# Patient Record
Sex: Female | Born: 1955
Health system: Southern US, Community
[De-identification: ages and names within clinical notes are randomized; demographics above are authoritative.]

## PROBLEM LIST (undated history)

## (undated) DIAGNOSIS — Z8742 Personal history of other diseases of the female genital tract: Secondary | ICD-10-CM

## (undated) DIAGNOSIS — I2510 Atherosclerotic heart disease of native coronary artery without angina pectoris: Secondary | ICD-10-CM

## (undated) DIAGNOSIS — B009 Herpesviral infection, unspecified: Secondary | ICD-10-CM

## (undated) DIAGNOSIS — IMO0002 Reserved for concepts with insufficient information to code with codable children: Secondary | ICD-10-CM

## (undated) DIAGNOSIS — E785 Hyperlipidemia, unspecified: Secondary | ICD-10-CM

## (undated) DIAGNOSIS — M8580 Other specified disorders of bone density and structure, unspecified site: Secondary | ICD-10-CM

## (undated) DIAGNOSIS — I10 Essential (primary) hypertension: Secondary | ICD-10-CM

## (undated) DIAGNOSIS — Z8619 Personal history of other infectious and parasitic diseases: Secondary | ICD-10-CM

## (undated) DIAGNOSIS — K219 Gastro-esophageal reflux disease without esophagitis: Secondary | ICD-10-CM

## (undated) DIAGNOSIS — F329 Major depressive disorder, single episode, unspecified: Secondary | ICD-10-CM

## (undated) DIAGNOSIS — F32A Depression, unspecified: Secondary | ICD-10-CM

## (undated) DIAGNOSIS — C801 Malignant (primary) neoplasm, unspecified: Secondary | ICD-10-CM

## (undated) HISTORY — DX: Major depressive disorder, single episode, unspecified: F32.9

## (undated) HISTORY — DX: Personal history of other infectious and parasitic diseases: Z86.19

## (undated) HISTORY — DX: Reserved for concepts with insufficient information to code with codable children: IMO0002

## (undated) HISTORY — DX: Other specified disorders of bone density and structure, unspecified site: M85.80

## (undated) HISTORY — DX: Hyperlipidemia, unspecified: E78.5

## (undated) HISTORY — DX: Essential (primary) hypertension: I10

## (undated) HISTORY — DX: Herpesviral infection, unspecified: B00.9

## (undated) HISTORY — DX: Atherosclerotic heart disease of native coronary artery without angina pectoris: I25.10

## (undated) HISTORY — DX: Personal history of other diseases of the female genital tract: Z87.42

## (undated) HISTORY — PX: CARDIAC CATHETERIZATION: SHX172

## (undated) HISTORY — DX: Depression, unspecified: F32.A

---
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
## 1983-08-27 HISTORY — PX: COLPOSCOPY: SHX161

## 1988-08-26 DIAGNOSIS — Z8742 Personal history of other diseases of the female genital tract: Secondary | ICD-10-CM

## 1988-08-26 HISTORY — DX: Personal history of other diseases of the female genital tract: Z87.42

## 1988-08-26 HISTORY — PX: TUBAL LIGATION: SHX77

## 1998-08-26 HISTORY — PX: BASAL CELL CARCINOMA EXCISION: SHX1214

## 2003-05-19 ENCOUNTER — Other Ambulatory Visit: Admission: RE | Admit: 2003-05-19 | Discharge: 2003-05-19 | Payer: Self-pay | Admitting: Dermatology

## 2003-07-08 ENCOUNTER — Other Ambulatory Visit: Admission: RE | Admit: 2003-07-08 | Discharge: 2003-07-08 | Payer: Self-pay | Admitting: Obstetrics and Gynecology

## 2003-08-27 LAB — HM COLONOSCOPY: HM Colonoscopy: NORMAL

## 2003-10-28 ENCOUNTER — Other Ambulatory Visit: Admission: RE | Admit: 2003-10-28 | Discharge: 2003-10-28 | Payer: Self-pay | Admitting: Obstetrics and Gynecology

## 2003-11-11 ENCOUNTER — Ambulatory Visit (HOSPITAL_BASED_OUTPATIENT_CLINIC_OR_DEPARTMENT_OTHER): Admission: RE | Admit: 2003-11-11 | Discharge: 2003-11-11 | Payer: Self-pay | Admitting: Urology

## 2003-11-11 ENCOUNTER — Ambulatory Visit (HOSPITAL_COMMUNITY): Admission: RE | Admit: 2003-11-11 | Discharge: 2003-11-11 | Payer: Self-pay | Admitting: Urology

## 2004-03-01 ENCOUNTER — Ambulatory Visit (HOSPITAL_COMMUNITY): Admission: RE | Admit: 2004-03-01 | Discharge: 2004-03-01 | Payer: Self-pay | Admitting: Family Medicine

## 2004-07-24 ENCOUNTER — Ambulatory Visit (HOSPITAL_COMMUNITY): Admission: RE | Admit: 2004-07-24 | Discharge: 2004-07-24 | Payer: Self-pay | Admitting: Orthopedic Surgery

## 2004-09-06 ENCOUNTER — Ambulatory Visit: Payer: Self-pay | Admitting: Family Medicine

## 2004-11-09 ENCOUNTER — Ambulatory Visit (HOSPITAL_COMMUNITY): Admission: RE | Admit: 2004-11-09 | Discharge: 2004-11-09 | Payer: Self-pay | Admitting: General Surgery

## 2004-12-04 ENCOUNTER — Ambulatory Visit (HOSPITAL_COMMUNITY): Admission: RE | Admit: 2004-12-04 | Discharge: 2004-12-04 | Payer: Self-pay | Admitting: Family Medicine

## 2005-10-02 ENCOUNTER — Ambulatory Visit: Payer: Self-pay | Admitting: Family Medicine

## 2005-10-02 LAB — CONVERTED CEMR LAB: Pap Smear: NORMAL

## 2006-02-12 ENCOUNTER — Ambulatory Visit (HOSPITAL_COMMUNITY): Admission: RE | Admit: 2006-02-12 | Discharge: 2006-02-12 | Payer: Self-pay | Admitting: Family Medicine

## 2006-10-02 ENCOUNTER — Encounter: Payer: Self-pay | Admitting: Family Medicine

## 2006-10-02 LAB — CONVERTED CEMR LAB
ALT: 28 units/L (ref 0–35)
AST: 22 units/L (ref 0–37)
Albumin: 4.3 g/dL (ref 3.5–5.2)
Alkaline Phosphatase: 88 units/L (ref 39–117)
BUN: 12 mg/dL (ref 6–23)
Bilirubin, Direct: 0.1 mg/dL (ref 0.0–0.3)
CO2: 24 meq/L (ref 19–32)
Calcium: 9.6 mg/dL (ref 8.4–10.5)
Chloride: 104 meq/L (ref 96–112)
Cholesterol: 166 mg/dL (ref 0–200)
Creatinine, Ser: 0.7 mg/dL (ref 0.40–1.20)
Glucose, Bld: 80 mg/dL (ref 70–99)
HDL: 55 mg/dL (ref >39)
Indirect Bilirubin: 0.3 mg/dL (ref 0.0–0.9)
LDL Cholesterol: 98 mg/dL (ref 0–99)
Potassium: 4.9 meq/L (ref 3.5–5.3)
Sodium: 141 meq/L (ref 135–145)
TSH: 1.519 microintl units/mL (ref 0.350–5.50)
Total Bilirubin: 0.4 mg/dL (ref 0.3–1.2)
Total CHOL/HDL Ratio: 3
Total Protein: 7.4 g/dL (ref 6.0–8.3)
Triglycerides: 66 mg/dL (ref <150)
VLDL: 13 mg/dL (ref 0–40)

## 2006-10-06 ENCOUNTER — Other Ambulatory Visit: Admission: RE | Admit: 2006-10-06 | Discharge: 2006-10-06 | Payer: Self-pay | Admitting: Family Medicine

## 2006-10-06 ENCOUNTER — Ambulatory Visit: Payer: Self-pay | Admitting: Family Medicine

## 2006-10-06 ENCOUNTER — Encounter (INDEPENDENT_AMBULATORY_CARE_PROVIDER_SITE_OTHER): Payer: Self-pay | Admitting: *Deleted

## 2006-10-06 LAB — CONVERTED CEMR LAB: Pap Smear: NORMAL

## 2007-05-13 ENCOUNTER — Ambulatory Visit: Payer: Self-pay | Admitting: Family Medicine

## 2007-05-18 ENCOUNTER — Ambulatory Visit (HOSPITAL_COMMUNITY): Admission: RE | Admit: 2007-05-18 | Discharge: 2007-05-18 | Payer: Self-pay | Admitting: Family Medicine

## 2007-05-19 ENCOUNTER — Ambulatory Visit (HOSPITAL_COMMUNITY): Admission: RE | Admit: 2007-05-19 | Discharge: 2007-05-19 | Payer: Self-pay | Admitting: Family Medicine

## 2007-05-28 ENCOUNTER — Encounter: Payer: Self-pay | Admitting: Family Medicine

## 2007-05-28 LAB — CONVERTED CEMR LAB
ALT: 34 units/L (ref 0–35)
AST: 27 units/L (ref 0–37)
Albumin: 4.5 g/dL (ref 3.5–5.2)
Alkaline Phosphatase: 88 units/L (ref 39–117)
BUN: 10 mg/dL (ref 6–23)
Bilirubin, Direct: 0.1 mg/dL (ref 0.0–0.3)
CO2: 25 meq/L (ref 19–32)
Calcium: 9.4 mg/dL (ref 8.4–10.5)
Chloride: 105 meq/L (ref 96–112)
Cholesterol: 180 mg/dL (ref 0–200)
Creatinine, Ser: 0.74 mg/dL (ref 0.40–1.20)
Glucose, Bld: 84 mg/dL (ref 70–99)
HDL: 56 mg/dL (ref >39)
Indirect Bilirubin: 0.4 mg/dL (ref 0.0–0.9)
LDL Cholesterol: 106 mg/dL — ABNORMAL HIGH (ref 0–99)
Potassium: 4.9 meq/L (ref 3.5–5.3)
Sodium: 142 meq/L (ref 135–145)
Total Bilirubin: 0.5 mg/dL (ref 0.3–1.2)
Total CHOL/HDL Ratio: 3.2
Total Protein: 7.6 g/dL (ref 6.0–8.3)
Triglycerides: 89 mg/dL (ref <150)
VLDL: 18 mg/dL (ref 0–40)

## 2007-08-27 ENCOUNTER — Encounter: Payer: Self-pay | Admitting: Family Medicine

## 2007-10-13 ENCOUNTER — Ambulatory Visit: Payer: Self-pay | Admitting: Family Medicine

## 2007-10-13 ENCOUNTER — Other Ambulatory Visit: Admission: RE | Admit: 2007-10-13 | Discharge: 2007-10-13 | Payer: Self-pay | Admitting: Family Medicine

## 2007-10-13 ENCOUNTER — Encounter: Payer: Self-pay | Admitting: Family Medicine

## 2007-10-13 LAB — CONVERTED CEMR LAB: Pap Smear: NORMAL

## 2008-03-10 ENCOUNTER — Encounter: Payer: Self-pay | Admitting: Family Medicine

## 2008-03-10 LAB — CONVERTED CEMR LAB
ALT: 22 units/L (ref 0–35)
AST: 20 units/L (ref 0–37)
Albumin: 4.4 g/dL (ref 3.5–5.2)
Alkaline Phosphatase: 88 units/L (ref 39–117)
Bilirubin, Direct: 0.1 mg/dL (ref 0.0–0.3)
Cholesterol: 182 mg/dL (ref 0–200)
HDL: 44 mg/dL (ref >39)
Indirect Bilirubin: 0.5 mg/dL (ref 0.0–0.9)
LDL Cholesterol: 110 mg/dL — ABNORMAL HIGH (ref 0–99)
Total Bilirubin: 0.6 mg/dL (ref 0.3–1.2)
Total CHOL/HDL Ratio: 4.1
Total Protein: 7.9 g/dL (ref 6.0–8.3)
Triglycerides: 141 mg/dL (ref <150)
VLDL: 28 mg/dL (ref 0–40)

## 2008-03-11 ENCOUNTER — Encounter: Payer: Self-pay | Admitting: Family Medicine

## 2008-03-11 DIAGNOSIS — M899 Disorder of bone, unspecified: Secondary | ICD-10-CM | POA: Insufficient documentation

## 2008-03-11 DIAGNOSIS — M949 Disorder of cartilage, unspecified: Secondary | ICD-10-CM

## 2008-03-11 DIAGNOSIS — E782 Mixed hyperlipidemia: Secondary | ICD-10-CM | POA: Insufficient documentation

## 2008-03-11 DIAGNOSIS — I10 Essential (primary) hypertension: Secondary | ICD-10-CM

## 2008-03-11 DIAGNOSIS — F329 Major depressive disorder, single episode, unspecified: Secondary | ICD-10-CM

## 2008-04-15 ENCOUNTER — Telehealth: Payer: Self-pay | Admitting: Family Medicine

## 2008-05-25 ENCOUNTER — Ambulatory Visit (HOSPITAL_COMMUNITY): Admission: RE | Admit: 2008-05-25 | Discharge: 2008-05-25 | Payer: Self-pay | Admitting: Family Medicine

## 2008-10-21 ENCOUNTER — Encounter (INDEPENDENT_AMBULATORY_CARE_PROVIDER_SITE_OTHER): Payer: Self-pay | Admitting: Internal Medicine

## 2008-10-21 ENCOUNTER — Ambulatory Visit: Payer: Self-pay | Admitting: Internal Medicine

## 2008-10-21 ENCOUNTER — Other Ambulatory Visit: Admission: RE | Admit: 2008-10-21 | Discharge: 2008-10-21 | Payer: Self-pay | Admitting: Internal Medicine

## 2008-10-21 DIAGNOSIS — Z85828 Personal history of other malignant neoplasm of skin: Secondary | ICD-10-CM

## 2008-10-21 DIAGNOSIS — Z87898 Personal history of other specified conditions: Secondary | ICD-10-CM

## 2008-10-21 DIAGNOSIS — J309 Allergic rhinitis, unspecified: Secondary | ICD-10-CM | POA: Insufficient documentation

## 2008-10-21 DIAGNOSIS — N390 Urinary tract infection, site not specified: Secondary | ICD-10-CM | POA: Insufficient documentation

## 2008-10-21 LAB — CONVERTED CEMR LAB: OCCULT 1: NEGATIVE

## 2008-10-24 ENCOUNTER — Encounter (INDEPENDENT_AMBULATORY_CARE_PROVIDER_SITE_OTHER): Payer: Self-pay | Admitting: Internal Medicine

## 2008-10-25 LAB — CONVERTED CEMR LAB
ALT: 25 units/L (ref 0–35)
AST: 20 units/L (ref 0–37)
Albumin: 4.5 g/dL (ref 3.5–5.2)
Alkaline Phosphatase: 70 units/L (ref 39–117)
BUN: 14 mg/dL (ref 6–23)
CO2: 23 meq/L (ref 19–32)
Calcium: 9.3 mg/dL (ref 8.4–10.5)
Chloride: 107 meq/L (ref 96–112)
Cholesterol: 152 mg/dL (ref 0–200)
Creatinine, Ser: 0.66 mg/dL (ref 0.40–1.20)
Glucose, Bld: 84 mg/dL (ref 70–99)
HDL: 46 mg/dL (ref >39)
LDL Cholesterol: 93 mg/dL (ref 0–99)
Potassium: 5.5 meq/L — ABNORMAL HIGH (ref 3.5–5.3)
Sodium: 143 meq/L (ref 135–145)
Total Bilirubin: 0.6 mg/dL (ref 0.3–1.2)
Total CHOL/HDL Ratio: 3.3
Total Protein: 7.3 g/dL (ref 6.0–8.3)
Triglycerides: 64 mg/dL (ref <150)
VLDL: 13 mg/dL (ref 0–40)

## 2008-10-26 ENCOUNTER — Telehealth (INDEPENDENT_AMBULATORY_CARE_PROVIDER_SITE_OTHER): Payer: Self-pay | Admitting: Internal Medicine

## 2008-10-27 ENCOUNTER — Encounter (INDEPENDENT_AMBULATORY_CARE_PROVIDER_SITE_OTHER): Payer: Self-pay | Admitting: Internal Medicine

## 2008-10-28 ENCOUNTER — Encounter (INDEPENDENT_AMBULATORY_CARE_PROVIDER_SITE_OTHER): Payer: Self-pay | Admitting: Internal Medicine

## 2009-01-16 ENCOUNTER — Ambulatory Visit: Payer: Self-pay | Admitting: Internal Medicine

## 2009-01-16 DIAGNOSIS — H6590 Unspecified nonsuppurative otitis media, unspecified ear: Secondary | ICD-10-CM | POA: Insufficient documentation

## 2009-05-19 ENCOUNTER — Ambulatory Visit (HOSPITAL_COMMUNITY): Admission: RE | Admit: 2009-05-19 | Discharge: 2009-05-19 | Payer: Self-pay | Admitting: Family Medicine

## 2009-05-31 ENCOUNTER — Ambulatory Visit (HOSPITAL_COMMUNITY): Admission: RE | Admit: 2009-05-31 | Discharge: 2009-05-31 | Payer: Self-pay | Admitting: Family Medicine

## 2010-07-30 ENCOUNTER — Ambulatory Visit (HOSPITAL_COMMUNITY)
Admission: RE | Admit: 2010-07-30 | Discharge: 2010-07-30 | Payer: Self-pay | Source: Home / Self Care | Admitting: Internal Medicine

## 2010-09-03 ENCOUNTER — Encounter: Payer: Self-pay | Admitting: Cardiology

## 2010-09-15 ENCOUNTER — Encounter: Payer: Self-pay | Admitting: Family Medicine

## 2010-09-16 ENCOUNTER — Encounter: Payer: Self-pay | Admitting: Internal Medicine

## 2010-09-25 NOTE — Letter (Signed)
Summary: RPC chart  RPC chart   Imported By: Curtis Sites 03/15/2010 12:47:22  _____________________________________________________________________  External Attachment:    Type:   Image     Comment:   External Document

## 2011-01-11 NOTE — Op Note (Signed)
Melinda Garza, Melinda Garza                           ACCOUNT NO.:  [id number]   MEDICAL RECORD NO.:  [id number]                   PATIENT TYPE:  AMB   LOCATION:  [facility]                                 FACILITY:  [facility]   PHYSICIAN:  Jamison Neighbor, M.D.               DATE OF BIRTH:  [birth year]   DATE OF PROCEDURE:  11/11/2003  DATE OF DISCHARGE:                                 OPERATIVE REPORT   PREOPERATIVE DIAGNOSIS:  Chronic pelvic pain, rule out interstitial  cystitis.   POSTOPERATIVE DIAGNOSIS:  Chronic pelvic pain, rule out interstitial  cystitis.   OPERATION/PROCEDURE:  1. Cystoscopy.  2. Urethral calibration.  3. Hydrodistention of the bladder.  4. Marcaine and Pyridium instillation, Marcaine and Kenalog injections.   SURGEON:  Jamison Neighbor, M.D.   ANESTHESIA:  General.   COMPLICATIONS:  None.   DRAINS:  None.   BRIEF HISTORY:  The patient is a 55 year old who was told many years ago  that she had interstitial cystitis.  The diagnosis was made by Dr. Bryson Corona up in [place] who performed a hydrodistention.  The patient was  started on a low dose of Elavil and did reasonably well.   The patient has recently developed some problems with burning, frequency,  urgency, low back pain, and saw Dr. Wanda Plump who recommended  anticholinergic therapy and arranged for her to come in and be seen for  fiberoptic cystoscopy.  The patient felt like she wanted to be treated for  possible interstitial cystitis and came back in for followup.   The patient was restarted on Elavil as well as antibiotic therapy because of  the presence of white cells in her urine.  She does feel somewhat better but  needs to undergo diagnostic cystoscopy once again to determine if she really  does have interstitial cystitis.  We note that her preoperative PUF score is  elevated at 20 and would certainly suggest she is at high risk for IC.  The  patient understands the risks and benefits of the  procedure.  She  understands that there may be some discomfort postoperatively but she will  be appropriately managed.  She gave full informed consent.   DESCRIPTION OF PROCEDURE:  After successful induction of general anesthesia,  the patient was placed in the dorsal lithotomy position, prepped with  Betadine and draped in the usual sterile fashion.  Careful bimanual  examination revealed a modest cystocele, no rectocele, no enterocele, no  mass on bimanual exam.  The urethra was unremarkable with no signs of  diverticulum.  The urethra was calibrated to 32-French with female urethral  sounds with no evidence of stenosis or stricture.  The cystoscope was  inserted.  The bladder was carefully inspected.  It was free of any tumor or  stones.  Both ureteral orifices were normal in configuration and location.  A modest degree of squamous metaplasia in the  area of the trigone was  identified.  This was felt to be normal on physiologic.  Hydrodistention was  performed at 100 cm of water pressure for five minutes.  When the bladder  was drained, there was little if any in the way of glomerulations.  No  ulcers could be seen.  There was essentially no bleeding.  The bladder  capacity turned out to be 1100 mL.  This is essentially normal with normal  being 1150 and the average for interstitial cystitis is 575.  The bladder  was drained.  Once again, there was no need for any biopsy.  A mixture of  Marcaine and Pyridium was left in the bladder.  Marcaine and Kenalog were  injected periurethrally.  The patient received intraoperative Toradol and  Zofran as well as B&O suppository, all in an attempt to keep her symptom-  free.   She will be sent home with Lorcet Plus, Urelle and Levaquin.  Return to the  office to see me in followup.  At that point we will discuss these findings  and made the decision as to whether we wish to treat for IC with Elmiron and  Atarax or just continue her current  therapy.  Long term it may be best to  just maintain her on antibiotic prophylaxis plus a low dose of Elavil which  has worked well for her in the past.                                               Jamison Neighbor, M.D.    RJE/MEDQ  D:  11/11/2003  T:  11/11/2003  Job:  [id number]

## 2011-01-11 NOTE — H&P (Signed)
NAME:  Melinda Garza, Melinda Garza                 ACCOUNT NO.:  [id number]   MEDICAL RECORD NO.:  [id number]          PATIENT TYPE:  AMB   LOCATION:                                 FACILITY:   PHYSICIAN:  Dalia Heading, M.D.  DATE OF BIRTH:  10/22/1955   DATE OF ADMISSION:  DATE OF DISCHARGE:  LH                                HISTORY & PHYSICAL   CHIEF COMPLAINT:  Hemoccult positive stool, need for screening colonoscopy.   HISTORY OF PRESENT ILLNESS:  The patient is a 55 year old who is referred  for endoscopic evaluation. Needs a colonoscopy for Hemoccult positive stools  found by her primary care physician. No abdominal pain, weight loss, nausea,  vomiting, diarrhea, constipation, or melena have been noted. She has never  had a colonoscopy. There is no family history of colon carcinoma.   PAST MEDICAL HISTORY:  1.  Hypertension.  2.  High cholesterol levels.   PAST SURGICAL HISTORY:  1.  Appendectomy.  2.  Tubal ligation.  3.  Bladder surgery.   CURRENT MEDICATIONS:  Norvasc, Lipitor, amitriptyline.   ALLERGIES:  SULFA.   REVIEW OF SYSTEMS:  Noncontributory.   PHYSICAL EXAMINATION:  GENERAL:  The patient is a well-developed, well-  nourished, white female in no acute distress. She is afebrile and vital  signs are stable.  LUNGS:  Clear to auscultation with equal breath sounds bilaterally.  HEART:  Reveals regular rate and rhythm without S3, S4, or murmurs.  ABDOMEN:  Soft, nontender, nondistended. No hepatosplenomegaly or masses are  noted.  RECTAL:  Deferred to the procedure.   IMPRESSION:  Hematochezia, need for screening colonoscopy.   PLAN:  The patient is scheduled for colonoscopy on November 09, 2004. The risks  and benefits of the procedure including bleeding and perforation were fully  explained to the patient who gave informed consent.      MAJ/MEDQ  D:  10/25/2004  T:  10/25/2004  Job:  [id number]   cc:   [facility]  Fax: [phone]   Milus Mallick. Lodema Hong,  M.D.  [address]  [place], [place] [address]  Fax: [phone]

## 2011-07-09 ENCOUNTER — Other Ambulatory Visit (HOSPITAL_COMMUNITY): Payer: Self-pay | Admitting: Internal Medicine

## 2011-07-09 DIAGNOSIS — Z139 Encounter for screening, unspecified: Secondary | ICD-10-CM

## 2011-08-05 ENCOUNTER — Ambulatory Visit (HOSPITAL_COMMUNITY)
Admission: RE | Admit: 2011-08-05 | Discharge: 2011-08-05 | Disposition: A | Discharge status: Home / Self Care | Payer: BC Managed Care – PPO | Source: Ambulatory Visit | Attending: Internal Medicine | Admitting: Internal Medicine

## 2011-08-05 DIAGNOSIS — Z1231 Encounter for screening mammogram for malignant neoplasm of breast: Secondary | ICD-10-CM | POA: Insufficient documentation

## 2011-08-05 DIAGNOSIS — Z139 Encounter for screening, unspecified: Secondary | ICD-10-CM

## 2011-11-20 LAB — HM PAP SMEAR: HM Pap smear: NORMAL

## 2012-07-31 ENCOUNTER — Other Ambulatory Visit (HOSPITAL_COMMUNITY): Payer: Self-pay | Admitting: Internal Medicine

## 2012-07-31 DIAGNOSIS — Z09 Encounter for follow-up examination after completed treatment for conditions other than malignant neoplasm: Secondary | ICD-10-CM

## 2012-08-10 ENCOUNTER — Ambulatory Visit (HOSPITAL_COMMUNITY): Payer: BC Managed Care – PPO

## 2012-08-31 ENCOUNTER — Ambulatory Visit (HOSPITAL_COMMUNITY)
Admission: RE | Admit: 2012-08-31 | Discharge: 2012-08-31 | Disposition: A | Discharge status: Home / Self Care | Payer: BC Managed Care – PPO | Source: Ambulatory Visit | Attending: Internal Medicine | Admitting: Internal Medicine

## 2012-08-31 DIAGNOSIS — Z1231 Encounter for screening mammogram for malignant neoplasm of breast: Secondary | ICD-10-CM | POA: Insufficient documentation

## 2012-08-31 DIAGNOSIS — Z09 Encounter for follow-up examination after completed treatment for conditions other than malignant neoplasm: Secondary | ICD-10-CM

## 2012-10-07 ENCOUNTER — Encounter: Payer: Self-pay | Admitting: Obstetrics and Gynecology

## 2012-10-24 LAB — HM MAMMOGRAPHY: HM Mammogram: NORMAL

## 2012-11-18 ENCOUNTER — Ambulatory Visit (HOSPITAL_COMMUNITY)
Admission: RE | Admit: 2012-11-18 | Discharge: 2012-11-18 | Disposition: A | Discharge status: Home / Self Care | Payer: BC Managed Care – PPO | Source: Ambulatory Visit | Attending: Cardiovascular Disease | Admitting: Cardiovascular Disease

## 2012-11-18 DIAGNOSIS — I10 Essential (primary) hypertension: Secondary | ICD-10-CM | POA: Insufficient documentation

## 2012-11-18 DIAGNOSIS — R011 Cardiac murmur, unspecified: Secondary | ICD-10-CM | POA: Insufficient documentation

## 2012-11-18 NOTE — Progress Notes (Signed)
*  PRELIMINARY RESULTS* Echocardiogram 2D Echocardiogram has been performed.  Melinda Garza 11/18/2012, 2:20 PM 

## 2012-12-04 ENCOUNTER — Encounter: Payer: Self-pay | Admitting: Obstetrics and Gynecology

## 2012-12-04 ENCOUNTER — Ambulatory Visit (INDEPENDENT_AMBULATORY_CARE_PROVIDER_SITE_OTHER): Payer: BC Managed Care – PPO | Admitting: Obstetrics and Gynecology

## 2012-12-04 VITALS — BP 118/60 | Ht 65.0 in | Wt 143.0 lb

## 2012-12-04 DIAGNOSIS — Z01419 Encounter for gynecological examination (general) (routine) without abnormal findings: Secondary | ICD-10-CM

## 2012-12-04 NOTE — Patient Instructions (Signed)

## 2012-12-04 NOTE — Progress Notes (Signed)
57 y.o.  Married  Caucasian female   G2P2002 here for annual exam.  Daughter in [place] had a bab boy this year.  No new medical problems.  Her cardiologist checked Vit D and it was low and is taking 50,000 IU's weekly.  She reported to him (Dr. Alanda Amass) that she has had a "little ache" just under her ribs on the right side for years, and he rec: she tell me about it for me to check her out for gyn cause.     Patient's last menstrual period was 08/27/2003.          Sexually active: yes  The current method of family planning is tubal ligation.    Exercising: walking, yoga 5 days a week Last mammogram:  08/2012 normal Last pap smear:11/20/11 neg History of abnormal pap: 1992 Smoking:no Alcohol: alcohol, wine 5 glasses a week Last colonoscopy:2005 normal every 10 years Last Bone Density:  2010 osteopenia Last tetanus shot: 11/20/11 Last cholesterol check: 10/2012 not resulted yet(Dr. Alanda Amass)  Hgb:        pcp        Urine:pcp    Health Maintenance  Topic Date Due  . Tetanus/tdap  09/01/1973  . Colonoscopy  09/01/2004  . Influenza Vaccine  04/26/2013  . Mammogram  08/31/2014  . Pap Smear  11/20/2014    Family History  Problem Relation Age of Onset  . Hypertension Mother   . Osteoporosis Mother   . Hypertension Father   . Heart attack Father   . Hypertension Sister   . Breast cancer Other     Patient Active Problem List  Diagnosis  . HYPERLIPIDEMIA  . DEPRESSION  . OTITIS MEDIA, SEROUS, LEFT  . HYPERTENSION  . ALLERGIC RHINITIS  . UTI'S, RECURRENT  . OSTEOPENIA  . SKIN CANCER, HX OF  . MIGRAINES, HX OF    Past Medical History  Diagnosis Date  . Depression   . Osteopenia     Past Surgical History  Procedure Laterality Date  . Appendectomy  1963  . Tubal ligation  1990  . Colposcopy  1985    Allergies: Sulfonamide derivatives  Current Outpatient Prescriptions  Medication Sig Dispense Refill  . aspirin 81 MG tablet Take 81 mg by mouth daily.      .  ergocalciferol (VITAMIN D2) 50000 UNITS capsule Take 50,000 Units by mouth once a week.      . metoprolol succinate (TOPROL-XL) 25 MG 24 hr tablet Take 25 mg by mouth every other day.       . Vitamin D, Ergocalciferol, (DRISDOL) 50000 UNITS CAPS Take 50,000 Units by mouth once a week.       No current facility-administered medications for this visit.    ROS: Pertinent items are noted in HPI.  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  Daughter in [place] just had a baby boy.  Son lives in [place] and she plans to go to [place] this June for the first time to visit him.    Exam:    BP 118/60  Ht 5\' 5"  (1.651 m)  Wt 143 lb (64.864 kg)  BMI 23.8 kg/m2  LMP 08/27/2003  Up 8 pounds since last year Wt Readings from Last 3 Encounters:  12/04/12 143 lb (64.864 kg)  01/16/09 132 lb (59.875 kg)  10/21/08 131 lb 4 oz (59.535 kg)     Ht Readings from Last 3 Encounters:  12/04/12 5\' 5"  (  1.651 m)  01/16/09 5\' 7"  (1.702 m)  10/21/08 5\' 7"  (1.702 m)  Height stable since last year.   General appearance: alert, cooperative and appears stated age Head: Normocephalic, without obvious abnormality, atraumatic Neck: no adenopathy, supple, symmetrical, trachea midline and thyroid not enlarged, symmetric, no tenderness/mass/nodules Lungs: clear to auscultation bilaterally Breasts: Inspection negative, No nipple retraction or dimpling, No nipple discharge or bleeding, No axillary or supraclavicular adenopathy, Normal to palpation without dominant masses Heart: regular rate and rhythm Abdomen: soft, non-tender; bowel sounds normal; no masses,  no organomegaly.  No tenderness in RUQ on current exam. Extremities: extremities normal, atraumatic, no cyanosis or edema Skin: Skin color, texture, turgor normal. No rashes or lesions Lymph nodes: Cervical, supraclavicular, and axillary nodes normal. No abnormal inguinal nodes  palpated Neurologic: Grossly normal   Pelvic: External genitalia:  no lesions, but atrophic appearing              Urethra:  normal appearing urethra with no masses, tenderness or lesions              Bartholins and Skenes: normal                 Vagina: normal appearing vagina with normal color and discharge, no lesions, Grade 1-2 cystocele (asymptomatic).  1 cm sebacous cyst at hymeneal ring on right.                Cervix: normal appearance, flush with vault              Pap taken: no        Bimanual Exam:  Uterus:  uterus is normal size, shape, consistency and nontender, AF,mobile                                      Adnexa: normal adnexa in size, nontender and no masses                                      Rectovaginal: Confirms                                      Anus:  normal sphincter tone, no lesions  A: normal menopausal exam, never used HRT     Long standing "ache" in RUQ, no gyn cause found     P: mammogram counseled on breast self exam, mammography screening, adequate intake of calcium and vitamin D, diet and exercise return annually or prn     An After Visit Summary was printed and given to the patient.

## 2013-01-08 ENCOUNTER — Other Ambulatory Visit: Payer: Self-pay | Admitting: Cardiovascular Disease

## 2013-01-08 LAB — BASIC METABOLIC PANEL
BUN: 10 mg/dL (ref 6–23)
Calcium: 9.7 mg/dL (ref 8.4–10.5)
Glucose, Bld: 85 mg/dL (ref 70–99)
Potassium: 4.4 mEq/L (ref 3.5–5.3)
Sodium: 139 mEq/L (ref 135–145)

## 2013-08-03 ENCOUNTER — Other Ambulatory Visit (HOSPITAL_COMMUNITY): Payer: Self-pay | Admitting: Internal Medicine

## 2013-08-03 DIAGNOSIS — Z139 Encounter for screening, unspecified: Secondary | ICD-10-CM

## 2013-09-06 ENCOUNTER — Ambulatory Visit (HOSPITAL_COMMUNITY)
Admission: RE | Admit: 2013-09-06 | Discharge: 2013-09-06 | Disposition: A | Discharge status: Home / Self Care | Payer: BC Managed Care – PPO | Source: Ambulatory Visit | Attending: Internal Medicine | Admitting: Internal Medicine

## 2013-09-06 DIAGNOSIS — Z139 Encounter for screening, unspecified: Secondary | ICD-10-CM

## 2013-09-06 DIAGNOSIS — Z1231 Encounter for screening mammogram for malignant neoplasm of breast: Secondary | ICD-10-CM | POA: Insufficient documentation

## 2013-12-10 ENCOUNTER — Encounter: Payer: Self-pay | Admitting: Obstetrics and Gynecology

## 2013-12-10 ENCOUNTER — Ambulatory Visit: Payer: BC Managed Care – PPO | Admitting: Obstetrics and Gynecology

## 2013-12-10 ENCOUNTER — Ambulatory Visit (INDEPENDENT_AMBULATORY_CARE_PROVIDER_SITE_OTHER): Payer: BC Managed Care – PPO | Admitting: Obstetrics and Gynecology

## 2013-12-10 VITALS — BP 120/80 | HR 80 | Resp 18 | Ht 65.25 in | Wt 140.0 lb

## 2013-12-10 DIAGNOSIS — Z0000 Encounter for general adult medical examination without abnormal findings: Secondary | ICD-10-CM

## 2013-12-10 DIAGNOSIS — M8580 Other specified disorders of bone density and structure, unspecified site: Secondary | ICD-10-CM

## 2013-12-10 DIAGNOSIS — M899 Disorder of bone, unspecified: Secondary | ICD-10-CM

## 2013-12-10 DIAGNOSIS — Z01419 Encounter for gynecological examination (general) (routine) without abnormal findings: Secondary | ICD-10-CM

## 2013-12-10 DIAGNOSIS — M949 Disorder of cartilage, unspecified: Secondary | ICD-10-CM

## 2013-12-10 LAB — POCT URINALYSIS DIPSTICK
Bilirubin, UA: NEGATIVE
GLUCOSE UA: NEGATIVE
Ketones, UA: NEGATIVE
Leukocytes, UA: NEGATIVE
Nitrite, UA: NEGATIVE
Protein, UA: NEGATIVE
RBC UA: NEGATIVE
UROBILINOGEN UA: NEGATIVE
pH, UA: 5

## 2013-12-10 MED ORDER — NYSTATIN 100000 UNIT/GM EX POWD: Freq: Three times a day (TID) | CUTANEOUS | Status: DC

## 2013-12-10 NOTE — Patient Instructions (Signed)

## 2013-12-10 NOTE — Progress Notes (Signed)
Patient ID: Melinda Garza, female   DOB: 1955-11-13, 58 y.o.   MRN: [id number] GYNECOLOGY VISIT  PCP:  Redmond School, MD (Linna Hoff, [place])  Referring provider:   HPI: 58 y.o.   Married  Caucasian  female   G2P2002 with Patient's last menstrual period was 08/27/2003.   here for  AEX.  Considering breast reduction.  Has a lot of weight to the breast.  Some back pain.  Has physical therapy in the past.  Struggling wiht a good bra. Has yeast infections under breasts.  May new a new prescription for this.   Hgb:    PCP Urine:  negative  GYNECOLOGIC HISTORY: Patient's last menstrual period was 08/27/2003. Sexually active:  yes  Partner preference: female Contraception:   Tubal Menopausal hormone therapy: no DES exposure:  no  Blood transfusions:  no  Sexually transmitted diseases:  no  GYN procedures and prior surgeries:  Tubal ligation, colposcopy Last mammogram: 09-06-13 wnl:[facility]                Last pap and high risk HPV testing:  11-20-11 wnl:no HPV testing.  History of abnormal pap smear: 30 years ago had colposcopy for abnormal pap but no treatment to cervix.  Frequent follow up paps were normal.    OB History   Grav Para Term Preterm Abortions TAB SAB Ect Mult Living   2 2 2       2        LIFESTYLE: Exercise:  Treadmill             Tobacco:   no Alcohol:      3-4 drinks per week Drug use:  no  OTHER HEALTH MAINTENANCE: Tetanus/TDap:   11-20-11 Gardisil:               n/a Influenza:             never Zostavax:              n/a  Bone density:        04/2009 revealed osteopenia Colonoscopy:         2006 wnl in [place].  Next colonoscopy due in 2016.  Cholesterol check:   Borderline with PcP  Family History  Problem Relation Age of Onset  . Hypertension Mother   . Osteoporosis Mother   . Hyperlipidemia Mother   . Hypertension Father   . Heart attack Father   . Hyperlipidemia Father   . Hypertension Sister   . Breast cancer Other   .  Hypertension Brother   . Stroke Maternal Grandmother   . Breast cancer Paternal Grandmother     Patient Active Problem List   Diagnosis Date Noted  . OTITIS MEDIA, SEROUS, LEFT 01/16/2009  . ALLERGIC RHINITIS 10/21/2008  . UTI'S, RECURRENT 10/21/2008  . SKIN CANCER, HX OF 10/21/2008  . MIGRAINES, HX OF 10/21/2008  . HYPERLIPIDEMIA 03/11/2008  . DEPRESSION 03/11/2008  . HYPERTENSION 03/11/2008  . OSTEOPENIA 03/11/2008   Past Medical History  Diagnosis Date  . Depression   . Osteopenia   . Dyspareunia   . Hypertension   . History of abnormal cervical Pap smear 1990    Past Surgical History  Procedure Laterality Date  . Appendectomy  1963  . Tubal ligation  1990  . Colposcopy  1985  . Basal cell carcinoma excision  2000    --removed from nose    ALLERGIES: Sulfonamide derivatives  Current Outpatient Prescriptions  Medication Sig Dispense Refill  . aspirin  81 MG tablet Take 81 mg by mouth daily.      . ergocalciferol (VITAMIN D2) 50000 UNITS capsule Take 50,000 Units by mouth once a week.      . metoprolol succinate (TOPROL-XL) 25 MG 24 hr tablet Take 25 mg by mouth every other day.        No current facility-administered medications for this visit.     ROS:  Pertinent items are noted in HPI.  SOCIAL HISTORY:  Administrative work.  PHYSICAL EXAMINATION:    BP 120/80  Pulse 80  Resp 18  Ht 5' 5.25" (1.657 m)  Wt 140 lb (63.504 kg)  BMI 23.13 kg/m2  LMP 08/27/2003   Wt Readings from Last 3 Encounters:  12/10/13 140 lb (63.504 kg)  12/04/12 143 lb (64.864 kg)  01/16/09 132 lb (59.875 kg)     Ht Readings from Last 3 Encounters:  12/10/13 5' 5.25" (1.657 m)  12/04/12 5\' 5"  (1.651 m)  01/16/09 5\' 7"  (1.702 m)    General appearance: alert, cooperative and appears stated age Head: Normocephalic, without obvious abnormality, atraumatic Neck: no adenopathy, supple, symmetrical, trachea midline and thyroid not enlarged, symmetric, no  tenderness/mass/nodules Lungs: clear to auscultation bilaterally Breasts: Inspection negative, No nipple retraction or dimpling, No nipple discharge or bleeding, No axillary or supraclavicular adenopathy, Normal to palpation without dominant masses Heart: regular rate and rhythm Abdomen: right oblique scar in RLQ, soft, non-tender; no masses,  no organomegaly Extremities: extremities normal, atraumatic, no cyanosis or edema Skin: Skin color, texture, turgor normal. No rashes or lesions Lymph nodes: Cervical, supraclavicular, and axillary nodes normal. No abnormal inguinal nodes palpated Neurologic: Grossly normal  Pelvic: External genitalia:  no lesions              Urethra:  normal appearing urethra with no masses, tenderness or lesions              Bartholins and Skenes: normal                 Vagina: normal appearing vagina with normal color and discharge, no lesions              Cervix: normal appearance              Pap and high risk HPV testing done: yes.            Bimanual Exam:  Uterus:  uterus is normal size, shape, consistency and nontender                                      Adnexa: normal adnexa in size, nontender and no masses                                      Rectovaginal: Confirms                                      Anus:  normal sphincter tone, no lesions.  Hemorrhoids noted.   ASSESSMENT  Normal gynecologic exam. Osteopenia. History of candida of skin under breasts.   PLAN  Mammogram recommended yearly.  Pap smear and high risk HPV testing Bone density at [facility]  Nystatin powder Rx. Counseled on self breast exam, Calcium and vitamin D intake,  exercise. Return annually or prn   An After Visit Summary was printed and given to the patient.

## 2013-12-15 LAB — IPS PAP TEST WITH HPV

## 2013-12-15 NOTE — Addendum Note (Signed)
Addended by: Tacy Learn, Atalaya Zappia E on: 12/15/2013 11:08 AM   Modules accepted: Orders

## 2013-12-18 LAB — IPS HPV GENOTYPING 16/18

## 2013-12-27 ENCOUNTER — Telehealth: Payer: Self-pay | Admitting: Orthopedic Surgery

## 2013-12-27 NOTE — Telephone Encounter (Signed)
Spoke with patient. Results given as seen below from [place]. Patient agreeable and verbalizes understanding.  Notes Recorded by Oleta Mouse, RN on 12/27/2013 at 9:24 AM LMTCB. Notes Recorded by Jamey Reas de Berton Lan, MD on 12/22/2013 at 9:32 AM Please contact patient to tell her that her pap and HR HPV testing were negative. By mistake, she had reflex subtyping for HPV 16 and 18 processed, which were negative. (She will not be charged for this.)  Her pap results were released to [facility] on 12/20/13 for their records per her request.   Routing to provider for final review. Patient agreeable to disposition. Will close encounter

## 2013-12-27 NOTE — Telephone Encounter (Signed)
LMTCB for results. 

## 2014-01-06 ENCOUNTER — Encounter: Payer: Self-pay | Admitting: *Deleted

## 2014-01-07 ENCOUNTER — Ambulatory Visit (INDEPENDENT_AMBULATORY_CARE_PROVIDER_SITE_OTHER): Payer: BC Managed Care – PPO | Admitting: Cardiology

## 2014-01-07 ENCOUNTER — Ambulatory Visit: Payer: BC Managed Care – PPO | Admitting: Cardiology

## 2014-01-07 ENCOUNTER — Encounter: Payer: Self-pay | Admitting: Cardiology

## 2014-01-07 VITALS — BP 128/82 | HR 64 | Ht 65.0 in | Wt 142.0 lb

## 2014-01-07 DIAGNOSIS — R002 Palpitations: Secondary | ICD-10-CM | POA: Insufficient documentation

## 2014-01-07 DIAGNOSIS — I10 Essential (primary) hypertension: Secondary | ICD-10-CM

## 2014-01-07 DIAGNOSIS — E785 Hyperlipidemia, unspecified: Secondary | ICD-10-CM

## 2014-01-07 DIAGNOSIS — I2510 Atherosclerotic heart disease of native coronary artery without angina pectoris: Secondary | ICD-10-CM | POA: Insufficient documentation

## 2014-01-07 DIAGNOSIS — R0789 Other chest pain: Secondary | ICD-10-CM

## 2014-01-07 NOTE — Assessment & Plan Note (Signed)
Blood pressure is reasonably well controlled today.

## 2014-01-07 NOTE — Assessment & Plan Note (Signed)
Nocturnal, gets better when she sits up, could be reflux/indigestion. I have asked her to try an antacid to see if this helps.

## 2014-01-07 NOTE — Assessment & Plan Note (Addendum)
Patient recalls being told she had a "50% blockage" at cardiac catheterization approximately 10 years ago. I will try to obtain the old report. She is not reporting any obvious angina symptoms with exertion. She is on aspirin and back on statin therapy. We discussed risk factor modification. We discussed making at least an annual cardiology followup for routine evaluation to determine if she needs any additional testing.

## 2014-01-07 NOTE — Progress Notes (Signed)
Clinical Summary Ms. Boulay is a 58 y.o.female referred for cardiology consultation by Dr. [name]. She reports an intermittent feeling of forceful heartbeat, not specifically tachycardia however. There does not seem to be any trigger by her account, although the symptoms are more noticeable when she is at rest. When she is up and active, including exercise, she does not notice the symptoms. She has had no dizziness or syncope. She also does not endorse any exertional chest pain or breathlessness.   Not specifically related to the above symptoms, she also feels a discomfort in her chest sometimes at nighttime when she wakes up. This goes away when she gets up or sits up. She does report occasional reflux symptoms but does not take any medications for it. Again, she does not notice the symptoms with exercise.  She tells me that she has had cardiology evaluation over the years including a cardiac catheterization approximately 10 years ago with [facility]. She recalls being told that she had a "50% blockage" at that time. I do not have the report as yet for review. Since then she reports two additional stress tests, the last one being 2-3 years ago, negative for ischemia.  Echocardiogram from March 2014 revealed mild LVH with mildly reduced chamber size, LVEF 55-60% without regional wall motion abnormalities, normal diastolic function, mild mitral regurgitation.  ECG done today shows normal sinus rhythm. Toprol-XL was increased to twice daily dosing after her most recent visit with Dr. [name]. She has a history of hyperlipidemia, was having some intolerance to Lipitor and stopped it, although has recently gone back on it.   Allergies  Allergen Reactions  . Sulfonamide Derivatives Hives    Current Outpatient Prescriptions  Medication Sig Dispense Refill  . aspirin 81 MG tablet Take 81 mg by mouth daily.      Marland Kitchen atorvastatin (LIPITOR) 20 MG tablet Take 20 mg by mouth daily.      . ergocalciferol (VITAMIN  D2) 50000 UNITS capsule Take 50,000 Units by mouth once a week.      . levocetirizine (XYZAL) 5 MG tablet Take 5 mg by mouth every evening.      . metoprolol succinate (TOPROL-XL) 25 MG 24 hr tablet Take 12.5 mg by mouth 2 (two) times daily.        No current facility-administered medications for this visit.    Past Medical History  Diagnosis Date  . Depression   . Osteopenia   . Dyspareunia   . Essential hypertension, benign   . History of abnormal cervical Pap smear 1990  . History of hepatitis   . Hyperlipidemia     Past Surgical History  Procedure Laterality Date  . Appendectomy  1963  . Tubal ligation  1990  . Colposcopy  1985  . Basal cell carcinoma excision  2000    Removed from nose    Family History  Problem Relation Age of Onset  . Hypertension Mother   . Osteoporosis Mother   . Hyperlipidemia Mother   . Hypertension Father   . Heart attack Father   . Hyperlipidemia Father   . Hypertension Sister   . Breast cancer Other   . Hypertension Brother   . Stroke Maternal Grandmother   . Breast cancer Paternal Grandmother     Social History Ms. Amrhein reports that she has never smoked. She does not have any smokeless tobacco history on file. Ms. Dunnigan reports that she drinks about 1.5 ounces of alcohol per week.  Review of Systems Negative  except as outlined above.  Physical Examination Filed Vitals:   01/07/14 1342  BP: 128/82  Pulse: 64   Filed Weights   01/07/14 1342  Weight: 142 lb (64.411 kg)   Normally nourished appearing woman, comfortable at rest. HEENT: Conjunctiva and lids normal, oropharynx clear. Neck: Supple, no elevated JVP or carotid bruits, no thyromegaly. Lungs: Clear to auscultation, nonlabored breathing at rest. Cardiac: Regular rate and rhythm, no S3 or significant systolic murmur, no pericardial rub. Abdomen: Soft, nontender, bowel sounds present. Extremities: No pitting edema, distal pulses 2+. Skin: Warm and  dry. Musculoskeletal: No kyphosis. Neuropsychiatric: Alert and oriented x3, affect grossly appropriate.   Problem List and Plan   Palpitations Describes more of a sensation of forceful heartbeat rather than tachycardia. This is sporadic, usually noted when she is at rest. She has had no associated dizziness or syncope. She reports wearing a cardiac monitor approximally 3 years ago without definitive abnormality uncovered. Toprol-XL dose was just recently increased by Dr. [name]. She reports daily symptoms, we will obtain a 48-hour Holter to exclude any obvious dysrhythmias that might need further workup. Otherwise we discussed reducing her caffeine intake, stress reduction through exercise. Echocardiogram from last year reviewed. Her ECG is normal today.  Coronary atherosclerosis of native coronary artery Patient recalls being told she had a "50% blockage" at cardiac catheterization approximately 10 years ago. I will try to obtain the old report. She is not reporting any obvious angina symptoms with exertion. She is on aspirin and back on statin therapy. We discussed risk factor modification. We discussed making at least an annual cardiology followup for routine evaluation to determine if she needs any additional testing.  HYPERLIPIDEMIA Recently back on Lipitor. Would try to get LDL close to 70 if possible.  Essential hypertension, benign Blood pressure is reasonably well controlled today.  Atypical chest pain Nocturnal, gets better when she sits up, could be reflux/indigestion. I have asked her to try an antacid to see if this helps.    Satira Sark, M.D., F.A.C.C.

## 2014-01-07 NOTE — Assessment & Plan Note (Signed)
Recently back on Lipitor. Would try to get LDL close to 70 if possible.

## 2014-01-07 NOTE — Patient Instructions (Signed)
Your physician recommends that you schedule a follow-up appointment in: 1 year with Dr Ferne Reus will receive a reminder letter two months in advance reminding you to call and schedule your appointment. If you don't receive this letter, please contact our office.  Your physician has recommended that you wear a holter monitor. Holter monitors are medical devices that record the heart's electrical activity. Doctors most often use these monitors to diagnose arrhythmias. Arrhythmias are problems with the speed or rhythm of the heartbeat. The monitor is a small, portable device. You can wear one while you do your normal daily activities. This is usually used to diagnose what is causing palpitations/syncope (passing out).

## 2014-01-07 NOTE — Assessment & Plan Note (Signed)
Describes more of a sensation of forceful heartbeat rather than tachycardia. This is sporadic, usually noted when she is at rest. She has had no associated dizziness or syncope. She reports wearing a cardiac monitor approximally 3 years ago without definitive abnormality uncovered. Toprol-XL dose was just recently increased by Dr. [name]. She reports daily symptoms, we will obtain a 48-hour Holter to exclude any obvious dysrhythmias that might need further workup. Otherwise we discussed reducing her caffeine intake, stress reduction through exercise. Echocardiogram from last year reviewed. Her ECG is normal today.

## 2014-01-11 ENCOUNTER — Encounter: Payer: Self-pay | Admitting: Cardiology

## 2014-01-11 ENCOUNTER — Ambulatory Visit (HOSPITAL_COMMUNITY)
Admission: RE | Admit: 2014-01-11 | Discharge: 2014-01-11 | Disposition: A | Discharge status: Home / Self Care | Payer: BC Managed Care – PPO | Source: Ambulatory Visit | Attending: Cardiology | Admitting: Cardiology

## 2014-01-11 DIAGNOSIS — R002 Palpitations: Secondary | ICD-10-CM | POA: Insufficient documentation

## 2014-01-11 NOTE — Progress Notes (Signed)
48 hour Holter Monitor in progress. 

## 2014-01-18 ENCOUNTER — Telehealth: Payer: Self-pay

## 2014-01-18 NOTE — Telephone Encounter (Signed)
Pt's 48 hr monitor only recorded 29 hrs due to low battery  I have left message to speak with pt to see if she would prefer to repeat 48 hr monitor   NSR with rare PVC'S and PAC's no sustained arrhythmias or pauses.We can do further monitoring if she did not feel much in the way of symptoms during the limited recording

## 2014-01-20 NOTE — Telephone Encounter (Signed)
Pt will have repeat holter monitor done,apt 01/26/14

## 2014-01-26 ENCOUNTER — Ambulatory Visit (HOSPITAL_COMMUNITY)
Admission: RE | Admit: 2014-01-26 | Discharge: 2014-01-26 | Disposition: A | Discharge status: Home / Self Care | Payer: BC Managed Care – PPO | Source: Ambulatory Visit | Attending: Cardiology | Admitting: Cardiology

## 2014-01-26 DIAGNOSIS — R002 Palpitations: Secondary | ICD-10-CM

## 2014-01-26 NOTE — Progress Notes (Signed)
Repeat 48 hour Holter monitor in progress. No charge due to malfunction of equipment.

## 2014-03-11 ENCOUNTER — Other Ambulatory Visit (HOSPITAL_COMMUNITY): Payer: Self-pay | Admitting: Internal Medicine

## 2014-03-11 DIAGNOSIS — Z0000 Encounter for general adult medical examination without abnormal findings: Secondary | ICD-10-CM

## 2014-03-16 ENCOUNTER — Ambulatory Visit (HOSPITAL_COMMUNITY)
Admission: RE | Admit: 2014-03-16 | Discharge: 2014-03-16 | Disposition: A | Discharge status: Home / Self Care | Payer: BC Managed Care – PPO | Source: Ambulatory Visit | Attending: Internal Medicine | Admitting: Internal Medicine

## 2014-03-16 DIAGNOSIS — Z0000 Encounter for general adult medical examination without abnormal findings: Secondary | ICD-10-CM | POA: Insufficient documentation

## 2014-06-15 ENCOUNTER — Encounter (INDEPENDENT_AMBULATORY_CARE_PROVIDER_SITE_OTHER): Payer: Self-pay | Admitting: *Deleted

## 2014-06-16 ENCOUNTER — Telehealth: Payer: Self-pay | Admitting: Obstetrics and Gynecology

## 2014-06-16 NOTE — Telephone Encounter (Signed)
Dr cx/rs appt/ lmtcb to rs °

## 2014-06-27 ENCOUNTER — Encounter: Payer: Self-pay | Admitting: Cardiology

## 2014-06-30 ENCOUNTER — Ambulatory Visit (INDEPENDENT_AMBULATORY_CARE_PROVIDER_SITE_OTHER): Payer: BC Managed Care – PPO | Admitting: Otolaryngology

## 2014-06-30 DIAGNOSIS — J0101 Acute recurrent maxillary sinusitis: Secondary | ICD-10-CM

## 2014-07-05 ENCOUNTER — Telehealth (INDEPENDENT_AMBULATORY_CARE_PROVIDER_SITE_OTHER): Payer: Self-pay | Admitting: *Deleted

## 2014-07-05 ENCOUNTER — Ambulatory Visit (INDEPENDENT_AMBULATORY_CARE_PROVIDER_SITE_OTHER): Payer: BC Managed Care – PPO | Admitting: Internal Medicine

## 2014-07-05 ENCOUNTER — Other Ambulatory Visit (INDEPENDENT_AMBULATORY_CARE_PROVIDER_SITE_OTHER): Payer: Self-pay | Admitting: *Deleted

## 2014-07-05 ENCOUNTER — Encounter (INDEPENDENT_AMBULATORY_CARE_PROVIDER_SITE_OTHER): Payer: Self-pay | Admitting: Internal Medicine

## 2014-07-05 VITALS — BP 102/64 | HR 72 | Temp 97.1°F | Ht 65.5 in | Wt 145.8 lb

## 2014-07-05 DIAGNOSIS — K219 Gastro-esophageal reflux disease without esophagitis: Secondary | ICD-10-CM | POA: Insufficient documentation

## 2014-07-05 DIAGNOSIS — R1310 Dysphagia, unspecified: Secondary | ICD-10-CM

## 2014-07-05 DIAGNOSIS — R1314 Dysphagia, pharyngoesophageal phase: Secondary | ICD-10-CM

## 2014-07-05 DIAGNOSIS — Z1211 Encounter for screening for malignant neoplasm of colon: Secondary | ICD-10-CM

## 2014-07-05 DIAGNOSIS — K625 Hemorrhage of anus and rectum: Secondary | ICD-10-CM

## 2014-07-05 MED ORDER — PEG 3350-KCL-NA BICARB-NACL 420 G PO SOLR: 4000.0000 mL | Freq: Once | ORAL | Status: DC

## 2014-07-05 NOTE — Patient Instructions (Signed)
EGD/ED, colonoscopy

## 2014-07-05 NOTE — Progress Notes (Signed)
   Subjective:    Patient ID: Melinda Garza, female    DOB: [birth year], 58 y.o.   MRN: [id number]  HPI  Referred to our office for a sore throat. Seen an ENT Dr. Benjamine Mola. Laryngoscopy was okay except her throat was red. She has had sorethroat x 4 months.  She tells me she will wake up at night with a burning sensation in her chest.If she eats sweets, it will cause heart burn.  She has been on the Protonix BID. Protonix has not helped with the burning in her chest. Has helped with the heartburn. She has occasionally in the past had heartburn.  She does have dysphagia to meat. She has seen blood when she wiped. She says she has seen blood on the toilet tissue x 7 over the past 3 months. Saw BRRB on toilet tissue this weekend. She says she did not strain. Her last colonoscopy was about 10 yrs ago by Dr. Arnoldo Morale and she says it was normal. Appetite is good. No weight loss. Occasionally has rt upper quadrant pain. Per patient H. Pylori was negative.    No family hx of colon cancer  Review of Systems Past Medical History  Diagnosis Date  . Depression   . Osteopenia   . Dyspareunia   . Essential hypertension, benign   . History of abnormal cervical Pap smear 1990  . History of hepatitis   . Hyperlipidemia     Past Surgical History  Procedure Laterality Date  . Appendectomy  1963  . Tubal ligation  1990  . Colposcopy  1985  . Basal cell carcinoma excision  2000    Removed from nose    Allergies  Allergen Reactions  . Sulfonamide Derivatives Hives    Current Outpatient Prescriptions on File Prior to Visit  Medication Sig Dispense Refill  . aspirin 81 MG tablet Take 81 mg by mouth daily.    . ergocalciferol (VITAMIN D2) 50000 UNITS capsule Take 50,000 Units by mouth once a week.    . metoprolol succinate (TOPROL-XL) 25 MG 24 hr tablet Take 25 mg by mouth 2 (two) times daily.      No current facility-administered medications on file prior to visit.        Objective:   Physical Exam    Filed Vitals:   07/05/14 1012  Height: 5' 5.5" (1.664 m)  Weight: 145 lb 12.8 oz (66.134 kg)   Alert and oriented. Skin warm and dry. Oral mucosa is moist.   . Sclera anicteric, conjunctivae is pink. Thyroid not enlarged. No cervical lymphadenopathy. Lungs clear. Heart regular rate and rhythm.  Abdomen is soft. Bowel sounds are positive. No hepatomegaly. No abdominal masses felt. No tenderness.  No edema to lower extremities.       Assessment & Plan:  GERD, not controlled at this time even with double dose PPI. PUD needs to be ruled out. Rectal bleeding: Colonic neoplasm, AVM, polyp, hemorroids needs to be ruled out. EGD./ED, Colonoscopy

## 2014-07-05 NOTE — Telephone Encounter (Signed)
Patient needs trilyte 

## 2014-07-07 ENCOUNTER — Ambulatory Visit (HOSPITAL_COMMUNITY)
Admission: RE | Admit: 2014-07-07 | Discharge: 2014-07-07 | Disposition: A | Discharge status: Home / Self Care | Payer: BC Managed Care – PPO | Source: Ambulatory Visit | Attending: Family Medicine | Admitting: Family Medicine

## 2014-07-07 DIAGNOSIS — M5384 Other specified dorsopathies, thoracic region: Secondary | ICD-10-CM | POA: Diagnosis not present

## 2014-07-07 DIAGNOSIS — M6281 Muscle weakness (generalized): Secondary | ICD-10-CM

## 2014-07-07 DIAGNOSIS — M546 Pain in thoracic spine: Secondary | ICD-10-CM

## 2014-07-07 DIAGNOSIS — Z5189 Encounter for other specified aftercare: Secondary | ICD-10-CM | POA: Insufficient documentation

## 2014-07-07 DIAGNOSIS — M545 Low back pain: Secondary | ICD-10-CM | POA: Diagnosis not present

## 2014-07-07 DIAGNOSIS — M2560 Stiffness of unspecified joint, not elsewhere classified: Secondary | ICD-10-CM

## 2014-07-07 NOTE — Patient Instructions (Signed)
Thoracic Spine Matrix with Overhead Reaches                      

## 2014-07-07 NOTE — Therapy (Signed)
Physical Therapy Evaluation  Patient Details  Name: Melinda Garza MRN: [id number] Date of Birth: 1956/03/11  Encounter Date: 07/07/2014      PT End of Session - 07/07/14 1946    Visit Number 1   Number of Visits 16   Date for PT Re-Evaluation 08/06/14   Authorization Type BCBS   Authorization Time Period 09/26/14   Authorization - Visit Number 1   Authorization - Number of Visits 16   PT Start Time 1520   PT Stop Time 1600   PT Time Calculation (min) 40 min   Activity Tolerance Patient tolerated treatment well   Behavior During Therapy [facility] for tasks assessed/performed      Past Medical History  Diagnosis Date  . Depression   . Osteopenia   . Dyspareunia   . Essential hypertension, benign   . History of abnormal cervical Pap smear 1990  . History of hepatitis   . Hyperlipidemia     Past Surgical History  Procedure Laterality Date  . Appendectomy  1963  . Tubal ligation  1990  . Colposcopy  1985  . Basal cell carcinoma excision  2000    Removed from nose    LMP 08/27/2003  Visit Diagnosis:  Midline thoracic back pain  Weakness of trunk musculature  Stiffness of vertebral column      Subjective Assessment - 07/07/14 1939    Pertinent History Mid to upper back pain. Previous success with therapy. > 1 year of on and off pain in back, low back with prolonged standing. Pain in ball of Right foot recently flaired up. high blood pressure, high cholesterol, recent sore throat, occasional palpatations,    How long can you sit comfortably? no difficulty   How long can you stand comfortably? 1 hour   How long can you walk comfortably? 1 hour   Patient Stated Goals Decrease back pain.    Pain Onset More than a month ago          [facility] PT Assessment - 07/07/14 1940    Assessment   Medical Diagnosis Thoracic back pain secondary to stiffenss and weakness of trunk stabilizers.    Onset Date 07/07/13   Next MD Visit Melinda Garza (referring who only saw patient because  Melinda Garza was unavailable and Melinda Garza (primary who has being seeing patient for this)   Prior Therapy yes, helpful >1 year ago.    Precautions   Precautions None   Posture/Postural Control   Posture Comments Forward rounded shoulders and    AROM   Overall AROM Comments Limited thoracic spine posterior to anterior joint mobilizations.    Right Shoulder Flexion 170 Degrees   Right Shoulder ABduction 170 Degrees   Right Shoulder Horizontal ABduction 30 Degrees   Left Shoulder Flexion 170 Degrees   Left Shoulder ABduction 170 Degrees   Left Shoulder Horizontal ABduction 30 Degrees   Cervical - Right Rotation 60   Cervical - Left Rotation 53   Lumbar Flexion 76   Lumbar Extension 52   Thoracic - Right Rotation 73   Thoracic - Left Rotation 60   Strength   Overall Strength Comments Bilateral: Low trapezius, mid trapezius, and Rhomboids MMT 4-/5    Right Shoulder Flexion 5/5   Right Shoulder Extension 5/5   Right Shoulder ABduction 5/5   Right Shoulder Horizontal ABduction 4/5   Left Shoulder Flexion 5/5   Left Shoulder Extension 5/5   Left Shoulder ABduction 5/5   Left Shoulder Horizontal ABduction 4/5   Lumbar Flexion  4/5   Lumbar Extension 4/5   Thoracic Flexion 4/5   Thoracic Extension 4/5          [facility] Adult PT Treatment/Exercise - 07/07/14 1940    Lumbar Exercises: Seated   Other Seated Lumbar Exercises 3D thoracic spine excursion 10x          PT Education - 07/07/14 1640    Education provided Yes   Education Details 3D throacic spine excursions to be performed at least once daily 10x each direction for HEP.    Person(s) Educated Patient   Methods Explanation;Demonstration   Comprehension Verbalized understanding;Returned demonstration          PT Short Term Goals - 07/07/14 1948    PT SHORT TERM GOAL #1   Title Patient will improve Lt thoracic spine trunk rotation 70 degrees to thoracic spine mobility   Time 4   Period Weeks   Status New   PT SHORT TERM  GOAL #2   Title Patient will dmeonstraqte 4+/5 MMT of Mid trapezius, Lower Trapezius, and Rhomboid muscles indicating improved spine stability   Time 4   Period Weeks   Status New   PT SHORT TERM GOAL #3   Title Patient will dmeosntrate increased Trunk flexion/extension strength of 5/5 MMT to indicate improved trunk stability.    Time 4   Period Weeks   Status New           Plan - 07/07/14 1951    PT Frequency 2x / week   PT Duration 4 weeks     Problem List Patient Active Problem List   Diagnosis Date Noted  . GERD (gastroesophageal reflux disease) 07/05/2014  . Dysphagia, pharyngoesophageal phase 07/05/2014  . Rectal bleeding 07/05/2014  . Palpitations 01/07/2014  . Atypical chest pain 01/07/2014  . Coronary atherosclerosis of native coronary artery 01/07/2014  . HYPERLIPIDEMIA 03/11/2008  . DEPRESSION 03/11/2008  . Essential hypertension, benign 03/11/2008  . OSTEOPENIA 03/11/2008       Sylvia Helms R 07/07/2014, 7:55 PM

## 2014-07-08 ENCOUNTER — Telehealth (HOSPITAL_COMMUNITY): Payer: Self-pay

## 2014-07-08 NOTE — Telephone Encounter (Signed)
11/13 patient called earlier and questioned why her appointments went beyond 4 weeks.  I checked with Cash and he said that he did 2 x for 8 weeks because he anticipated therapy taking longer and to go ahead and get the physician to okay that time.  I called the patient back and left a message on answering machine.

## 2014-07-12 ENCOUNTER — Ambulatory Visit (HOSPITAL_COMMUNITY)
Admission: RE | Admit: 2014-07-12 | Discharge: 2014-07-12 | Disposition: A | Discharge status: Home / Self Care | Payer: BC Managed Care – PPO | Source: Ambulatory Visit | Attending: Family Medicine | Admitting: Family Medicine

## 2014-07-12 ENCOUNTER — Encounter (HOSPITAL_COMMUNITY): Payer: Self-pay | Admitting: Physical Therapy

## 2014-07-12 DIAGNOSIS — Z5189 Encounter for other specified aftercare: Secondary | ICD-10-CM | POA: Diagnosis not present

## 2014-07-12 DIAGNOSIS — M2560 Stiffness of unspecified joint, not elsewhere classified: Secondary | ICD-10-CM

## 2014-07-12 DIAGNOSIS — M546 Pain in thoracic spine: Secondary | ICD-10-CM

## 2014-07-12 DIAGNOSIS — M6281 Muscle weakness (generalized): Secondary | ICD-10-CM

## 2014-07-12 NOTE — Therapy (Signed)
Physical Therapy Treatment  Patient Details  Name: Melinda Garza MRN: [id number] Date of Birth: 1955/11/18  Encounter Date: 07/12/2014      PT End of Session - 07/12/14 1341    Visit Number 2   Number of Visits 16   Date for PT Re-Evaluation 08/06/14   Authorization Type [employer]   Authorization Time Period 09/26/14   Authorization - Visit Number 2   Authorization - Number of Visits 16   PT Start Time 1300   PT Stop Time 1340   PT Time Calculation (min) 40 min   Activity Tolerance Patient tolerated treatment well   Behavior During Therapy [facility] for tasks assessed/performed      Past Medical History  Diagnosis Date  . Depression   . Osteopenia   . Dyspareunia   . Essential hypertension, benign   . History of abnormal cervical Pap smear 1990  . History of hepatitis   . Hyperlipidemia     Past Surgical History  Procedure Laterality Date  . Appendectomy  1963  . Tubal ligation  1990  . Colposcopy  1985  . Basal cell carcinoma excision  2000    Removed from nose    LMP 08/27/2003  Visit Diagnosis:  Midline thoracic back pain  Weakness of trunk musculature  Stiffness of vertebral column      Subjective Assessment - 07/12/14 1304    Symptoms Patient states pain is a littl ebetter and that ist feels good to do some exercises after a long time off from exercise   Currently in Pain? No/denies            [facility] Adult PT Treatment/Exercise - 07/12/14 0001    Posture/Postural Control   Posture Comments Forward rounded shoulders and    Lumbar Exercises: Standing   Scapular Retraction Strengthening;10 reps   Theraband Level (Scapular Retraction) Level 3 (Green)   Scapular Retraction Limitations bilateral    Row Strengthening;10 reps;Theraband;Limitations   Theraband Level (Row) Level 3 (Green)   Row Limitations single arm and bilateral    Other Standing Lumbar Exercises Overhead dumbbell matrix common (low back strengthening) 10x each 3lb    Lumbar Exercises: Seated    Other Seated Lumbar Exercises 3D thoracic spine excursion 10x   Lumbar Exercises: Prone   Single Arm Raise 20 reps   Lumbar Exercises: Quadruped   Single Arm Raise 10 reps   Manual Therapy   Manual Therapy Joint mobilization   Joint Mobilization thoracic spine grade 3-4 joint mobilizations.    Shoulder Exercises: Stretch   Other Shoulder Stretches 3 way pectoral stretch 10x 3" withtherapist functional; manual reactions to improve scapula positioning   Other Shoulder Stretches Anterior shoulder stretch 10x 3 seconds          PT Education - 07/12/14 1340    Education provided Yes   Education Details HEP, see patient instructions: 3 way pec stretch, anterior shoulder stretch, and UE overhead dumbbell matrix common   Person(s) Educated Patient   Methods Explanation;Demonstration   Comprehension Verbalized understanding;Returned demonstration          PT Short Term Goals - 07/12/14 1345    PT SHORT TERM GOAL #1   Title Patient will improve Lt thoracic spine trunk rotation 70 degrees to thoracic spine mobility   PT SHORT TERM GOAL #2   Title Patient will dmeonstraqte 4+/5 MMT of Mid trapezius, Lower Trapezius, and Rhomboid muscles indicating improved spine stability   PT SHORT TERM GOAL #3   Title Patient will  dmeosntrate increased Trunk flexion/extension strength of 5/5 MMT to indicate improved trunk stability.           PT Long Term Goals - 07/12/14 1345    PT LONG TERM GOAL #1   Title Patient will be abel to sit, stand, drive wthout any pain.    Time 8   Period Weeks          Plan - 07/12/14 1344    Clinical Impression Statement Patient respondedswell to introduction of postural strengtheing exercises and shoulder stretches. no pain noted at end of session with improved ability to reach over head. HEP progressed; see patient education.    PT Next Visit Plan Introduce UE and LE ground matrix at elevated surface, Introduce Scapular push ups         Problem  List Patient Active Problem List   Diagnosis Date Noted  . GERD (gastroesophageal reflux disease) 07/05/2014  . Dysphagia, pharyngoesophageal phase 07/05/2014  . Rectal bleeding 07/05/2014  . Palpitations 01/07/2014  . Atypical chest pain 01/07/2014  . Coronary atherosclerosis of native coronary artery 01/07/2014  . HYPERLIPIDEMIA 03/11/2008  . DEPRESSION 03/11/2008  . Essential hypertension, benign 03/11/2008  . OSTEOPENIA 03/11/2008      Cory Rama R 07/12/2014, 1:47 PM

## 2014-07-12 NOTE — Patient Instructions (Signed)
Door Way Upper Extremity Stretches  Step Through (anterior shoulder and wrist flexor)   3 way Pectoral Stretch       UE Overhead Dumbbell Matrix 5x each every other day

## 2014-07-14 ENCOUNTER — Ambulatory Visit (HOSPITAL_COMMUNITY)
Admission: RE | Admit: 2014-07-14 | Discharge: 2014-07-14 | Disposition: A | Discharge status: Home / Self Care | Payer: BC Managed Care – PPO | Source: Ambulatory Visit | Attending: Family Medicine | Admitting: Family Medicine

## 2014-07-14 DIAGNOSIS — M2560 Stiffness of unspecified joint, not elsewhere classified: Secondary | ICD-10-CM

## 2014-07-14 DIAGNOSIS — Z5189 Encounter for other specified aftercare: Secondary | ICD-10-CM | POA: Diagnosis not present

## 2014-07-14 DIAGNOSIS — M6281 Muscle weakness (generalized): Secondary | ICD-10-CM

## 2014-07-14 DIAGNOSIS — M546 Pain in thoracic spine: Secondary | ICD-10-CM

## 2014-07-14 NOTE — Therapy (Signed)
Physical Therapy Treatment  Patient Details  Name: Melinda Garza MRN: [id number] Date of Birth: 12-11-1955  Encounter Date: 07/14/2014      PT End of Session - 07/14/14 1342    Visit Number 3   Number of Visits 16   Date for PT Re-Evaluation 08/06/14   Authorization Type BCBS   Authorization Time Period 09/26/14   Authorization - Visit Number 3   Authorization - Number of Visits 16   PT Start Time 1601   PT Stop Time 1345   PT Time Calculation (min) 42 min   Activity Tolerance Patient tolerated treatment well   Behavior During Therapy [facility] for tasks assessed/performed      Past Medical History  Diagnosis Date  . Depression   . Osteopenia   . Dyspareunia   . Essential hypertension, benign   . History of abnormal cervical Pap smear 1990  . History of hepatitis   . Hyperlipidemia     Past Surgical History  Procedure Laterality Date  . Appendectomy  1963  . Tubal ligation  1990  . Colposcopy  1985  . Basal cell carcinoma excision  2000    Removed from nose    LMP 08/27/2003  Visit Diagnosis:  Midline thoracic back pain  Weakness of trunk musculature  Stiffness of vertebral column      Subjective Assessment - 07/14/14 1306    Symptoms Patient states conitnued pain improvement, but notes fatigue with prolonged activity/sitting/standing.    Currently in Pain? No/denies            [facility] Adult PT Treatment/Exercise - 07/14/14 0001    Lumbar Exercises: Standing   Scapular Retraction Strengthening;10 reps   Theraband Level (Scapular Retraction) Level 4 (Blue)   Scapular Retraction Limitations bilateral    Row Strengthening;10 reps;Theraband;Limitations   Theraband Level (Row) Level 4 (Blue)   Row Limitations single arm and bilateral.   Other Standing Lumbar Exercises Overhead dumbbell matrix common (low back strengthening) 10x each 3lb    Lumbar Exercises: Seated   Other Seated Lumbar Exercises 3D thoracic spine excursion 10x   Lumbar Exercises: Prone   Single Arm Raise 20 reps   Lumbar Exercises: Quadruped   Single Arm Raise 10 reps   Straight Leg Raise 10 reps   Opposite Arm/Leg Raise 10 reps;Right arm/Left leg;Left arm/Right leg   Plank UE ground matrix 5x each   Manual Therapy   Manual Therapy Joint mobilization   Joint Mobilization thoracic spine grade 3-4 joint mobilizations.    Shoulder Exercises: Stretch   Other Shoulder Stretches 3 way pectoral stretch 10x 3" withtherapist functional; manual reactions to improve scapula positioning   Other Shoulder Stretches Anterior shoulder stretch 10x 3 seconds            PT Short Term Goals - 07/14/14 1334    PT SHORT TERM GOAL #1   Title Patient will improve Lt thoracic spine trunk rotation 70 degrees to thoracic spine mobility   PT SHORT TERM GOAL #2   Title Patient will dmeonstraqte 4+/5 MMT of Mid trapezius, Lower Trapezius, and Rhomboid muscles indicating improved spine stability   PT SHORT TERM GOAL #3   Title Patient will dmeosntrate increased Trunk flexion/extension strength of 5/5 MMT to indicate improved trunk stability.           PT Long Term Goals - 07/14/14 1334    PT LONG TERM GOAL #1   Title Patient will be abel to sit, stand, drive wthout any pain.  Time 8   Period Weeks          Plan - 07/14/14 1330    Clinical Impression Statement Patient responded well to exercises from last session with improved posture. continued exercises and progressed this sesssion with no increase in pain and good performance with minimal cuing.    PT Next Visit Plan Introduce UE ground matrix at elevated surface       Problem List Patient Active Problem List   Diagnosis Date Noted  . GERD (gastroesophageal reflux disease) 07/05/2014  . Dysphagia, pharyngoesophageal phase 07/05/2014  . Rectal bleeding 07/05/2014  . Palpitations 01/07/2014  . Atypical chest pain 01/07/2014  . Coronary atherosclerosis of native coronary artery 01/07/2014  . HYPERLIPIDEMIA 03/11/2008  .  DEPRESSION 03/11/2008  . Essential hypertension, benign 03/11/2008  . OSTEOPENIA 03/11/2008   Devona Konig PT DPT [phone]

## 2014-07-15 ENCOUNTER — Encounter: Payer: Self-pay | Admitting: Cardiology

## 2014-07-15 ENCOUNTER — Ambulatory Visit (INDEPENDENT_AMBULATORY_CARE_PROVIDER_SITE_OTHER): Payer: BC Managed Care – PPO | Admitting: Cardiology

## 2014-07-15 VITALS — BP 142/92 | HR 68 | Ht 65.0 in | Wt 147.0 lb

## 2014-07-15 DIAGNOSIS — I2510 Atherosclerotic heart disease of native coronary artery without angina pectoris: Secondary | ICD-10-CM

## 2014-07-15 DIAGNOSIS — E782 Mixed hyperlipidemia: Secondary | ICD-10-CM

## 2014-07-15 DIAGNOSIS — I10 Essential (primary) hypertension: Secondary | ICD-10-CM

## 2014-07-15 DIAGNOSIS — R002 Palpitations: Secondary | ICD-10-CM

## 2014-07-15 NOTE — Assessment & Plan Note (Signed)
PVCs documented by cardiac monitor. Continue beta blocker and observation for now.

## 2014-07-15 NOTE — Assessment & Plan Note (Signed)
She is now on low-dose Pravachol. Follow-up lab work per Dr. [name].

## 2014-07-15 NOTE — Patient Instructions (Signed)
Your physician wants you to follow-up in: 6 months You will receive a reminder letter in the mail two months in advance. If you don't receive a letter, please call our office to schedule the follow-up appointment.     Your physician recommends that you continue on your current medications as directed. Please refer to the Current Medication list given to you today.      Thank you for choosing [facility] !        

## 2014-07-15 NOTE — Assessment & Plan Note (Signed)
Symptomatically stable at this time with previous history of nonobstructive coronary atherosclerosis as outlined above. Continue aspirin, beta blocker, and statin. Follow-up arranged in 6 months.

## 2014-07-15 NOTE — Progress Notes (Signed)
Reason for visit: Follow-up palpitations, CAD  Clinical Summary Ms. Pasley is a 58 y.o.female seen in consultation back in May of this year. She presents for a routine visit. Since that time she reports occasional palpitations, less frequent. No dizziness or syncope. She has had no angina symptoms. Reports having other health concerns including foot pain which has limited her activity somewhat. She is doing physical therapy and trying to get more active. She reports compliance with her medications as outlined below. Also has been trying to cut back caffeine in her diet. Today we reviewed her prior heart catheterization results, continue observation and risk factor modification strategies.  48 hour Holter monitor obtained in June showed sinus rhythm with PVCs documented, one 4 beat burst noted. Beta blocker dose was increased and no further testing pursued at that time.  Echocardiogram from March 2014 revealed mild LVH with mildly reduced chamber size, LVEF 55-60% without regional wall motion abnormalities, normal diastolic function, mild mitral regurgitation.  She states that she is considering breast reduction surgery next spring.   Allergies  Allergen Reactions  . Sulfonamide Derivatives Hives    Current Outpatient Prescriptions  Medication Sig Dispense Refill  . aspirin 81 MG tablet Take 81 mg by mouth daily.    . ergocalciferol (VITAMIN D2) 50000 UNITS capsule Take 50,000 Units by mouth once a week.    . metoprolol succinate (TOPROL-XL) 25 MG 24 hr tablet Take 25 mg by mouth 2 (two) times daily.     . naproxen (NAPROSYN) 500 MG tablet Take 500 mg by mouth 2 (two) times daily with a meal. Only taking 1 month    . pantoprazole (PROTONIX) 40 MG tablet Take 40 mg by mouth 2 (two) times daily.    . polyethylene glycol-electrolytes (NULYTELY/GOLYTELY) 420 G solution Take 4,000 mLs by mouth once. 4000 mL 0  . pravastatin (PRAVACHOL) 10 MG tablet Take 10 mg by mouth daily.     No current  facility-administered medications for this visit.    Past Medical History  Diagnosis Date  . Depression   . Osteopenia   . Dyspareunia   . Essential hypertension, benign   . History of abnormal cervical Pap smear 1990  . History of hepatitis   . Hyperlipidemia   . CAD (coronary artery disease), native coronary artery     50% proximal RCA by heart catheterization 2004 [facility]    Past Surgical History  Procedure Laterality Date  . Appendectomy  1963  . Tubal ligation  1990  . Colposcopy  1985  . Basal cell carcinoma excision  2000    Removed from nose    Social History Ms. Kassem reports that she has never smoked. She has never used smokeless tobacco. Ms. Rambeau reports that she drinks about 1.8 oz of alcohol per week.  Review of Systems Complete review of systems negative except as otherwise outlined in the clinical summary.  Physical Examination Filed Vitals:   07/15/14 1323  BP: 142/92  Pulse: 68   Filed Weights   07/15/14 1323  Weight: 147 lb (66.679 kg)    Normally nourished appearing woman, comfortable at rest. HEENT: Conjunctiva and lids normal, oropharynx clear. Neck: Supple, no elevated JVP or carotid bruits, no thyromegaly. Lungs: Clear to auscultation, nonlabored breathing at rest. Cardiac: Regular rate and rhythm, no S3 or significant systolic murmur, no pericardial rub. Abdomen: Soft, nontender, bowel sounds present. Extremities: No pitting edema, distal pulses 2+.   Problem List and Plan   Coronary atherosclerosis of  native coronary artery Symptomatically stable at this time with previous history of nonobstructive coronary atherosclerosis as outlined above. Continue aspirin, beta blocker, and statin. Follow-up arranged in 6 months.  Essential hypertension, benign Blood pressure elevated today, she states that this has not recently been the case however at other health care visits. I asked her to keep an eye on this, limit sodium in her diet, also  increase activity as tolerated.  Mixed hyperlipidemia She is now on low-dose Pravachol. Follow-up lab work per Dr. [name].  Palpitations PVCs documented by cardiac monitor. Continue beta blocker and observation for now.    Satira Sark, M.D., F.A.C.C.

## 2014-07-15 NOTE — Assessment & Plan Note (Signed)
Blood pressure elevated today, she states that this has not recently been the case however at other health care visits. I asked her to keep an eye on this, limit sodium in her diet, also increase activity as tolerated.

## 2014-07-18 ENCOUNTER — Ambulatory Visit (HOSPITAL_COMMUNITY)
Admission: RE | Admit: 2014-07-18 | Discharge: 2014-07-18 | Disposition: A | Discharge status: Home / Self Care | Payer: BC Managed Care – PPO | Source: Ambulatory Visit | Attending: Family Medicine | Admitting: Family Medicine

## 2014-07-18 DIAGNOSIS — M546 Pain in thoracic spine: Secondary | ICD-10-CM

## 2014-07-18 DIAGNOSIS — Z5189 Encounter for other specified aftercare: Secondary | ICD-10-CM | POA: Diagnosis not present

## 2014-07-18 DIAGNOSIS — M6281 Muscle weakness (generalized): Secondary | ICD-10-CM

## 2014-07-18 DIAGNOSIS — M2560 Stiffness of unspecified joint, not elsewhere classified: Secondary | ICD-10-CM

## 2014-07-18 NOTE — Therapy (Signed)
Physical Therapy Treatment  Patient Details  Name: Melinda Garza MRN: [id number] Date of Birth: April 21, 1956  Encounter Date: 07/18/2014      PT End of Session - 07/18/14 1138    Visit Number 4   Number of Visits 16   Date for PT Re-Evaluation 08/06/14   Authorization Type BCBS   Authorization Time Period 09/26/14   Authorization - Visit Number 4   Authorization - Number of Visits 16   PT Start Time 1103   PT Stop Time 1148   PT Time Calculation (min) 45 min   Activity Tolerance Patient tolerated treatment well   Behavior During Therapy [facility] for tasks assessed/performed      Past Medical History  Diagnosis Date  . Depression   . Osteopenia   . Dyspareunia   . Essential hypertension, benign   . History of abnormal cervical Pap smear 1990  . History of hepatitis   . Hyperlipidemia   . CAD (coronary artery disease), native coronary artery     50% proximal RCA by heart catheterization 2004 [facility]    Past Surgical History  Procedure Laterality Date  . Appendectomy  1963  . Tubal ligation  1990  . Colposcopy  1985  . Basal cell carcinoma excision  2000    Removed from nose    LMP 08/27/2003  Visit Diagnosis:  Midline thoracic back pain  Weakness of trunk musculature  Stiffness of vertebral column      Subjective Assessment - 07/18/14 1112    Symptoms Pt stated she completed 3 hours ironing yesterday with Bil scapula pain 3/10, reported thoracic region is tired today.    Currently in Pain? Yes   Pain Score 3    Pain Location Thoracic   Pain Orientation Mid;Medial;Posterior            [facility] Adult PT Treatment/Exercise - 07/18/14 0001    Lumbar Exercises: Standing   Scapular Retraction Strengthening;10 reps   Theraband Level (Scapular Retraction) Level 4 (Blue)   Scapular Retraction Limitations bilateral    Row Strengthening;10 reps;Theraband;Limitations   Theraband Level (Row) Level 4 (Blue)   Row Limitations Bil in squat position   Shoulder Extension  Both;10 reps;Theraband   Theraband Level (Shoulder Extension) Level 4 (Blue)   Other Standing Lumbar Exercises Overhead dumbbell matrix common (low back strengthening) 10x each 3lb    Lumbar Exercises: Seated   Other Seated Lumbar Exercises 3D thoracic spine excursion 10x   Lumbar Exercises: Quadruped   Straight Leg Raise 10 reps   Straight Leg Raises Limitations 1# dowel rod     Opposite Arm/Leg Raise 10 reps;Right arm/Left leg;Left arm/Right leg   Opposite Arm/Leg Raise Limitations 1#   Plank UE and LE  ground matrix 5x each   Shoulder Exercises: Stretch   Other Shoulder Stretches 3 way pectoral stretch 10x 3" withtherapist functional; manual reactions to improve scapula positioning   Other Shoulder Stretches Anterior and posterior shoulder stretch 10x 3 seconds            PT Short Term Goals - 07/18/14 1123    PT SHORT TERM GOAL #1   Title Patient will improve Lt thoracic spine trunk rotation 70 degrees to thoracic spine mobility   Status On-going   PT SHORT TERM GOAL #2   Title Patient will dmeonstraqte 4+/5 MMT of Mid trapezius, Lower Trapezius, and Rhomboid muscles indicating improved spine stability   Status On-going   PT SHORT TERM GOAL #3   Title Patient will dmeosntrate  increased Trunk flexion/extension strength of 5/5 MMT to indicate improved trunk stability.    Status On-going          PT Long Term Goals - 07/18/14 1127    PT LONG TERM GOAL #1   Title Patient will be able to sit, stand, drive wthout any pain.    Status On-going          Plan - 07/18/14 1139    Clinical Impression Statement Added dowel rod across lower back in quadruped activities for improve awareness of stability.  Pt progressing well with increased challenge with activities with min cueing required following demonstration for task.  Added LE ground matrix for UE stability and abdominal activation.  Pt stated pain reduced at end of session.   PT Next Visit Plan Continue with current PT POC.   Begin lower traps pull down with theraband next session.          Problem List Patient Active Problem List   Diagnosis Date Noted  . GERD (gastroesophageal reflux disease) 07/05/2014  . Dysphagia, pharyngoesophageal phase 07/05/2014  . Rectal bleeding 07/05/2014  . Palpitations 01/07/2014  . Atypical chest pain 01/07/2014  . Coronary atherosclerosis of native coronary artery 01/07/2014  . Mixed hyperlipidemia 03/11/2008  . DEPRESSION 03/11/2008  . Essential hypertension, benign 03/11/2008  . OSTEOPENIA 03/11/2008   Ihor Austin, Deuel Aldona Lento 07/18/2014, 12:36 PM

## 2014-07-19 ENCOUNTER — Ambulatory Visit (HOSPITAL_COMMUNITY)
Admission: RE | Admit: 2014-07-19 | Discharge: 2014-07-19 | Disposition: A | Discharge status: Home / Self Care | Payer: BC Managed Care – PPO | Source: Ambulatory Visit | Attending: Family Medicine | Admitting: Family Medicine

## 2014-07-19 DIAGNOSIS — M546 Pain in thoracic spine: Secondary | ICD-10-CM

## 2014-07-19 DIAGNOSIS — M2560 Stiffness of unspecified joint, not elsewhere classified: Secondary | ICD-10-CM

## 2014-07-19 DIAGNOSIS — Z5189 Encounter for other specified aftercare: Secondary | ICD-10-CM | POA: Diagnosis not present

## 2014-07-19 DIAGNOSIS — M6281 Muscle weakness (generalized): Secondary | ICD-10-CM

## 2014-07-19 NOTE — Therapy (Signed)
Physical Therapy Treatment  Patient Details  Name: Melinda Garza MRN: [id number] Date of Birth: 06/08/1956  Encounter Date: 07/19/2014      PT End of Session - 07/19/14 1822    Visit Number 6   Number of Visits 16   Date for PT Re-Evaluation 08/06/14   Authorization Type BCBS   Authorization Time Period 09/26/14   Authorization - Visit Number 6   Authorization - Number of Visits 16   PT Start Time 1728   PT Stop Time 1812   PT Time Calculation (min) 44 min   Activity Tolerance Patient tolerated treatment well   Behavior During Therapy [facility] for tasks assessed/performed      Past Medical History  Diagnosis Date  . Depression   . Osteopenia   . Dyspareunia   . Essential hypertension, benign   . History of abnormal cervical Pap smear 1990  . History of hepatitis   . Hyperlipidemia   . CAD (coronary artery disease), native coronary artery     50% proximal RCA by heart catheterization 2004 [facility]    Past Surgical History  Procedure Laterality Date  . Appendectomy  1963  . Tubal ligation  1990  . Colposcopy  1985  . Basal cell carcinoma excision  2000    Removed from nose    LMP 08/27/2003  Visit Diagnosis:  Midline thoracic back pain  Stiffness of vertebral column  Weakness of trunk musculature      Subjective Assessment - 07/19/14 1731    Symptoms No complaints of pain today in low or mid back.     Currently in Pain? No/denies          [facility] PT Assessment - 07/19/14 1730    Assessment   Medical Diagnosis Thoracic back pain secondary to stiffenss and weakness of trunk stabilizers.    Onset Date 07/07/13   Next MD Visit Hilma Favors (referring who only saw patient because Hilma Favors was unavailable and [name] (primary who has being seeing patient for this)          [facility] Adult PT Treatment/Exercise - 07/19/14 1728    Exercises   Exercises Lumbar   Lumbar Exercises: Standing   Scapular Retraction 15 reps   Theraband Level (Scapular Retraction) Level 4 (Blue)    Row 15 reps;Both   Theraband Level (Row) Level 4 (Blue)   Shoulder Extension 15 reps;Both   Theraband Level (Shoulder Extension) Level 4 (Blue)   Other Standing Lumbar Exercises Pull Downs and (B) ER, BTB, x15 (B) UE   Lumbar Exercises: Seated   Other Seated Lumbar Exercises 3D thoracic spine excursion with UE reach 10x   Lumbar Exercises: Quadruped   Opposite Arm/Leg Raise 15 reps;Right arm/Left leg;Left arm/Right leg   Opposite Arm/Leg Raise Limitations 1# on back for guide to maintain neutral alignment   Plank UE and LE  ground matrix x10 each   Manual Therapy   Manual Therapy Joint mobilization   Joint Mobilization thoracic spine gr. II-IV jt mobs   Shoulder Exercises: Stretch   Other Shoulder Stretches 3 way pectoral stretch 10x 3" with therapist functional; manual reactions to improve scapula positioning   Other Shoulder Stretches Anterior and posterior shoulder stretch 10x 3 seconds            PT Short Term Goals - 07/19/14 1803    PT SHORT TERM GOAL #1   Title Patient will improve Lt thoracic spine trunk rotation 70 degrees to thoracic spine mobility   Status On-going  PT SHORT TERM GOAL #2   Title Patient will dmeonstraqte 4+/5 MMT of Mid trapezius, Lower Trapezius, and Rhomboid muscles indicating improved spine stability   Status On-going   PT SHORT TERM GOAL #3   Title Patient will dmeosntrate increased Trunk flexion/extension strength of 5/5 MMT to indicate improved trunk stability.    Status On-going          PT Long Term Goals - 07/19/14 1803    PT LONG TERM GOAL #1   Title Patient will be able to sit, stand, drive wthout any pain.    Status On-going          Plan - 07/19/14 1823    Clinical Impression Statement Continued strengthening exercises, increasing reps for thoracic stability exercises to tolerance.  Pt was able to add additional exercises for scapular strengtheing, with occasional VC to avoid shoulder hiking as a compensation for shoulder  retraction.  Noted good mobility of thoracic spine with joint mobs today, with only mild limitation in mobility with ~T7 mobs.    Pt will benefit from skilled therapeutic intervention in order to improve on the following deficits Decreased activity tolerance;Improper body mechanics;Decreased strength;Decreased range of motion;Impaired flexibility   PT Frequency 2x / week   PT Duration 4 weeks   PT Next Visit Plan Continue with current PT POC.          Problem List Patient Active Problem List   Diagnosis Date Noted  . GERD (gastroesophageal reflux disease) 07/05/2014  . Dysphagia, pharyngoesophageal phase 07/05/2014  . Rectal bleeding 07/05/2014  . Palpitations 01/07/2014  . Atypical chest pain 01/07/2014  . Coronary atherosclerosis of native coronary artery 01/07/2014  . Mixed hyperlipidemia 03/11/2008  . DEPRESSION 03/11/2008  . Essential hypertension, benign 03/11/2008  . OSTEOPENIA 03/11/2008     Lonna Cobb, DPT [phone]

## 2014-07-26 ENCOUNTER — Ambulatory Visit (HOSPITAL_COMMUNITY)
Admission: RE | Admit: 2014-07-26 | Discharge: 2014-07-26 | Disposition: A | Discharge status: Home / Self Care | Payer: BC Managed Care – PPO | Source: Ambulatory Visit | Attending: Internal Medicine | Admitting: Internal Medicine

## 2014-07-26 DIAGNOSIS — M6281 Muscle weakness (generalized): Secondary | ICD-10-CM | POA: Insufficient documentation

## 2014-07-26 DIAGNOSIS — M2560 Stiffness of unspecified joint, not elsewhere classified: Secondary | ICD-10-CM

## 2014-07-26 DIAGNOSIS — M5384 Other specified dorsopathies, thoracic region: Secondary | ICD-10-CM | POA: Insufficient documentation

## 2014-07-26 DIAGNOSIS — M545 Low back pain: Secondary | ICD-10-CM | POA: Diagnosis not present

## 2014-07-26 DIAGNOSIS — Z5189 Encounter for other specified aftercare: Secondary | ICD-10-CM | POA: Insufficient documentation

## 2014-07-26 DIAGNOSIS — M546 Pain in thoracic spine: Secondary | ICD-10-CM | POA: Diagnosis not present

## 2014-07-26 NOTE — Therapy (Signed)
Physical Therapy Treatment  Patient Details  Name: Melinda Garza MRN: [id number] Date of Birth: 10/10/1955  Encounter Date: 07/26/2014      PT End of Session - 07/26/14 1333    Visit Number 7   Number of Visits 16   Date for PT Re-Evaluation 08/06/14   Authorization Type BCBS   Authorization Time Period 09/26/14   Authorization - Visit Number 7   Authorization - Number of Visits 16   PT Start Time 1304   PT Stop Time 1345   PT Time Calculation (min) 41 min   Activity Tolerance Patient tolerated treatment well   Behavior During Therapy [facility] for tasks assessed/performed      Past Medical History  Diagnosis Date  . Depression   . Osteopenia   . Dyspareunia   . Essential hypertension, benign   . History of abnormal cervical Pap smear 1990  . History of hepatitis   . Hyperlipidemia   . CAD (coronary artery disease), native coronary artery     50% proximal RCA by heart catheterization 2004 [facility]    Past Surgical History  Procedure Laterality Date  . Appendectomy  1963  . Tubal ligation  1990  . Colposcopy  1985  . Basal cell carcinoma excision  2000    Removed from nose    LMP 08/27/2003  Visit Diagnosis:  Midline thoracic back pain  Stiffness of vertebral column  Weakness of trunk musculature      Subjective Assessment - 07/26/14 1306    Symptoms No complaints of pain today in low or mid back.  patient notes no pain over the last several weeks and improvign since of back being "less tired" when sitting and standing.    Currently in Pain? No/denies            [facility] Adult PT Treatment/Exercise - 07/26/14 0001    Lumbar Exercises: Standing   Scapular Retraction 20 reps   Theraband Level (Scapular Retraction) Level 4 (Blue)   Row Both;20 reps   Theraband Level (Row) Level 4 (Blue)   Row Limitations 20x bilateral and 10x single (both bands in same hand).    Shoulder Extension Both;20 reps   Theraband Level (Shoulder Extension) Level 4 (Blue)   Other  Standing Lumbar Exercises 4 way pick up with 5lb dumbbell 10x   Other Standing Lumbar Exercises Overhead dumbell matrix common 5lb dumbbell 10x   Lumbar Exercises: Seated   Other Seated Lumbar Exercises 3D thoracic spine excursion with UE reach with 3lb dumbbell10x   Lumbar Exercises: Quadruped   Plank UE and LE  ground matrix x10 each   Shoulder Exercises: Stretch   Other Shoulder Stretches 3 way pectoral stretch 10x 3" with therapist functional; manual reactions to improve scapula positioning   Other Shoulder Stretches Anterior and posterior shoulder stretch 10x 3 seconds            PT Short Term Goals - 07/26/14 1332    PT SHORT TERM GOAL #1   Title Patient will improve Lt thoracic spine trunk rotation 70 degrees to thoracic spine mobility   Status On-going   PT SHORT TERM GOAL #2   Title Patient will dmeonstraqte 4+/5 MMT of Mid trapezius, Lower Trapezius, and Rhomboid muscles indicating improved spine stability   Status On-going   PT SHORT TERM GOAL #3   Title Patient will dmeosntrate increased Trunk flexion/extension strength of 5/5 MMT to indicate improved trunk stability.    Status On-going  PT Long Term Goals - 07/26/14 1332    PT LONG TERM GOAL #1   Title Patient will be able to sit, stand, drive wthout any pain.    Time 8   Period Weeks   Status On-going          Plan - 07/26/14 1333    Clinical Impression Statement Continued strengthening exercises, increasing reps for thoracic stability exercises to tolerance. Pt was able to add additional exercises for scapular strengtheing, with occasional VC to avoid shoulder hiking as a compensation for shoulder retraction. Noted good mobility of thoracic spine during exercises today. patient noted only miild discomfort in low back following seated  thoracic spine excursions.    PT Next Visit Plan Continue with current PT POC focusing on increasing repetitions and resistance. Progress 4way pick up to 4 way pick up  and reach.        Problem List Patient Active Problem List   Diagnosis Date Noted  . GERD (gastroesophageal reflux disease) 07/05/2014  . Dysphagia, pharyngoesophageal phase 07/05/2014  . Rectal bleeding 07/05/2014  . Palpitations 01/07/2014  . Atypical chest pain 01/07/2014  . Coronary atherosclerosis of native coronary artery 01/07/2014  . Mixed hyperlipidemia 03/11/2008  . DEPRESSION 03/11/2008  . Essential hypertension, benign 03/11/2008  . OSTEOPENIA 03/11/2008   Devona Konig PT DPT [phone]

## 2014-07-28 ENCOUNTER — Ambulatory Visit (HOSPITAL_COMMUNITY)
Admission: RE | Admit: 2014-07-28 | Discharge: 2014-07-28 | Disposition: A | Discharge status: Home / Self Care | Payer: BC Managed Care – PPO | Source: Ambulatory Visit | Attending: Family Medicine | Admitting: Family Medicine

## 2014-07-28 DIAGNOSIS — Z5189 Encounter for other specified aftercare: Secondary | ICD-10-CM | POA: Diagnosis not present

## 2014-07-28 DIAGNOSIS — M2560 Stiffness of unspecified joint, not elsewhere classified: Secondary | ICD-10-CM

## 2014-07-28 DIAGNOSIS — M546 Pain in thoracic spine: Secondary | ICD-10-CM

## 2014-07-28 DIAGNOSIS — M6281 Muscle weakness (generalized): Secondary | ICD-10-CM

## 2014-07-28 NOTE — Patient Instructions (Signed)
4 Way Pick Up and Reach              Theraband worksheet for scapular retractoin, row and extension worksheet given

## 2014-07-28 NOTE — Therapy (Signed)
[facility] [address] [place], [place], [address] Phone: [phone]   Fax:  [phone]  Physical Therapy Treatment  Patient Details  Name: Melinda Garza MRN: [id number] Date of Birth: 1956/05/25  Encounter Date: 07/28/2014      PT End of Session - 07/28/14 1310    Visit Number 8   Number of Visits 16   Date for PT Re-Evaluation 08/06/14   Authorization Type [employer]   Authorization Time Period 09/26/14   Authorization - Visit Number 8   Authorization - Number of Visits 16   PT Start Time 1300   PT Stop Time 1343   PT Time Calculation (min) 43 min   Activity Tolerance Patient tolerated treatment well   Behavior During Therapy [facility] for tasks assessed/performed      Past Medical History  Diagnosis Date  . Depression   . Osteopenia   . Dyspareunia   . Essential hypertension, benign   . History of abnormal cervical Pap smear 1990  . History of hepatitis   . Hyperlipidemia   . CAD (coronary artery disease), native coronary artery     50% proximal RCA by heart catheterization 2004 [facility]    Past Surgical History  Procedure Laterality Date  . Appendectomy  1963  . Tubal ligation  1990  . Colposcopy  1985  . Basal cell carcinoma excision  2000    Removed from nose    LMP 08/27/2003  Visit Diagnosis:  Midline thoracic back pain  Stiffness of vertebral column  Weakness of trunk musculature      Subjective Assessment - 07/28/14 1308    Symptoms Feeling good today, compliance with new theraband exercises but does wish to review to assure correct technique.  Pain free today.   Currently in Pain? No/denies            [facility] Adult PT Treatment/Exercise - 07/28/14 1318    Exercises   Exercises Lumbar   Lumbar Exercises: Standing   Scapular Retraction 20 reps   Theraband Level (Scapular Retraction) Level 4 (Blue)   Scapular Retraction Limitations HEP   Row Both;20 reps   Theraband Level (Row) Level 4 (Blue)   Row Limitations HEP    Shoulder Extension Both;20 reps   Theraband Level (Shoulder Extension) Level 4 (Blue)   Shoulder Extension Limitations HEP   Other Standing Lumbar Exercises 4 way pick up and reach with 5lb dumbbell 10x   Other Standing Lumbar Exercises Overhead dumbell matrix common 5lb dumbbell 10x   Lumbar Exercises: Seated   Other Seated Lumbar Exercises 3D thoracic spine excursion with UE reach with 3lb dumbbell10x            PT Short Term Goals - 07/28/14 1332    PT SHORT TERM GOAL #1   Title Patient will improve Lt thoracic spine trunk rotation 70 degrees to thoracic spine mobility   Status On-going   PT SHORT TERM GOAL #2   Title Patient will dmeonstraqte 4+/5 MMT of Mid trapezius, Lower Trapezius, and Rhomboid muscles indicating improved spine stability   Status On-going   PT SHORT TERM GOAL #3   Title Patient will dmeosntrate increased Trunk flexion/extension strength of 5/5 MMT to indicate improved trunk stability.    Status On-going          PT Long Term Goals - 07/28/14 1332    PT LONG TERM GOAL #1   Title Patient will be able to sit, stand, drive wthout any pain.  Status On-going          Plan - 07/28/14 1325    Clinical Impression Statement Added reach component to 4 way pick up to improve trunk rotation and scapular mobiliity.  Therapist facilitation to improve Lt UE with horizontal abduction to improve scapular mobiltiy and cueingto reduce shoulder elevation with scapular retraction.  Pt making good gains with thoracic mobilty this session.     PT Next Visit Plan Reassess next session for possible discharge    PT Home Exercise Plan Postural strengthening worksheet and tband, 4way pick up and reach worksheet      Problem List Patient Active Problem List   Diagnosis Date Noted  . GERD (gastroesophageal reflux disease) 07/05/2014  . Dysphagia, pharyngoesophageal phase 07/05/2014  . Rectal bleeding 07/05/2014  . Palpitations 01/07/2014  . Atypical chest pain  01/07/2014  . Coronary atherosclerosis of native coronary artery 01/07/2014  . Mixed hyperlipidemia 03/11/2008  . DEPRESSION 03/11/2008  . Essential hypertension, benign 03/11/2008  . OSTEOPENIA 03/11/2008   Ihor Austin, [place] Aldona Lento 07/28/2014, 2:32 PM

## 2014-08-01 ENCOUNTER — Ambulatory Visit (HOSPITAL_COMMUNITY)
Admission: RE | Admit: 2014-08-01 | Discharge: 2014-08-01 | Disposition: A | Discharge status: Home / Self Care | Payer: BC Managed Care – PPO | Source: Ambulatory Visit | Attending: Internal Medicine | Admitting: Internal Medicine

## 2014-08-01 DIAGNOSIS — M2560 Stiffness of unspecified joint, not elsewhere classified: Secondary | ICD-10-CM

## 2014-08-01 DIAGNOSIS — Z5189 Encounter for other specified aftercare: Secondary | ICD-10-CM | POA: Diagnosis not present

## 2014-08-01 DIAGNOSIS — M546 Pain in thoracic spine: Secondary | ICD-10-CM

## 2014-08-01 DIAGNOSIS — M6281 Muscle weakness (generalized): Secondary | ICD-10-CM

## 2014-08-01 NOTE — Therapy (Signed)
[facility] [address] [place], [place], [address] Phone: [phone]   Fax:  [phone]  Physical Therapy Treatment  Patient Details  Name: Melinda TRENTHAM MRN: [id number] Date of Birth: 08-26-1956  Encounter Date: 08/01/2014      PT End of Session - 08/01/14 1331    Visit Number 9   Number of Visits 16   PT Start Time 5643   PT Stop Time 1328   PT Time Calculation (min) 25 min   Activity Tolerance Patient tolerated treatment well   Behavior During Therapy [facility] for tasks assessed/performed      Past Medical History  Diagnosis Date  . Depression   . Osteopenia   . Dyspareunia   . Essential hypertension, benign   . History of abnormal cervical Pap smear 1990  . History of hepatitis   . Hyperlipidemia   . CAD (coronary artery disease), native coronary artery     50% proximal RCA by heart catheterization 2004 [facility]    Past Surgical History  Procedure Laterality Date  . Appendectomy  1963  . Tubal ligation  1990  . Colposcopy  1985  . Basal cell carcinoma excision  2000    Removed from nose    LMP 08/27/2003  Visit Diagnosis:  Midline thoracic back pain  Stiffness of vertebral column  Weakness of trunk musculature      Subjective Assessment - 08/01/14 1309    Symptoms No complaints of pain.  Pt reports she feels good and is ready to D/C to (I) HEP.  No questions or concerns with her HEP.    How long can you sit comfortably? No difficulty.    How long can you walk comfortably? No complaints of pain with standing/ambulation throughout the day, though pt reports she has not been shopping for long periods of time to assess pain.    Currently in Pain? No/denies  No complaints of pain in the past week.           [facility] PT Assessment - 08/01/14 1309    Assessment   Medical Diagnosis Thoracic back pain secondary to stiffenss and weakness of trunk stabilizers.    Onset Date 07/07/13   Next MD Visit Hilma Favors (referring who only saw  patient because Hilma Favors was unavailable and [name] (primary who has being seeing patient for this)   Posture/Postural Control   Posture Comments Pt understand importance of maintaining proper posture to decrease stress to (B) shoulder, cervical and thoracic spine.    AROM   Cervical - Right Rotation 68  was 60   Cervical - Left Rotation 63  was 53   Thoracic - Right Rotation 73  was 73   Thoracic - Left Rotation 70  was 60   Strength   Overall Strength Comments --  (B) Mid trap/rhomboids 4+/5, lats/lower trap 4/5 (was 4-/5)   Lumbar Flexion 5/5  was 4/5   Lumbar Extension 5/5  was 4/5          [facility] Adult PT Treatment/Exercise - 08/01/14 1309    Lumbar Exercises: Standing   Other Standing Lumbar Exercises 4 way pick up and reach overhead with 3lb x10      08/01/14 1309  Lumbar Exercises: Standing  Other Standing Lumbar Exercises 4 way pick up and reach overhead with 3lb x10  Lumbar Exercises: Seated  Other Seated Lumbar Exercises 3D thoracic spine excursion with UE reach in chair for lower back support  Shoulder Exercises: Stretch  Other Shoulder  Stretches 3 way pectoral stretch 20" x2  Other Shoulder Stretches Anterior and posterior shoulder stretch 20x 3 seconds        PT Education - 08/01/14 1330    Education provided Yes   Education Details Importance of performance of continued HEP to maintain ROM, flexibility, adn strength.    Person(s) Educated Patient   Methods Explanation   Comprehension Verbalized understanding          PT Short Term Goals - 08/01/14 1328    PT SHORT TERM GOAL #1   Title Patient will improve Lt thoracic spine trunk rotation 70 degrees to thoracic spine mobility   Status Achieved   PT SHORT TERM GOAL #2   Title Patient will dmeonstraqte 4+/5 MMT of Mid trapezius, Lower Trapezius, and Rhomboid muscles indicating improved spine stability   Baseline Pt demonstrates increased strength of mid trap and rhomboids to 4+/5, and lower trap/lat  strength to 4/5.    Status Partially Met   PT SHORT TERM GOAL #3   Title Patient will dmeosntrate increased Trunk flexion/extension strength of 5/5 MMT to indicate improved trunk stability.    Status Achieved          PT Long Term Goals - 08/01/14 1314    PT LONG TERM GOAL #1   Title Patient will be able to sit, stand, drive wthout any pain.    Status Achieved          Plan - 08/01/14 1332    Clinical Impression Statement Treatment session completed with updated measurements for D/C. Pt reports no complaints of pain, and has been pain free for over 1 week.  Pt reports (I) with HEP with exercise sheets provided, and education was provided on how to increase exercises as tolerated.  Pt demonstrates improved ROM and strength from evaluation, meeting 2/3 STG and 1/1 LTG.  Pt only has mild deficiets in lower trp/lat strength, and education of progression for overhead reaching program was provided to continue at home to work on this strength. Pt reprots no concerns with transitioning to (I) HEP.    PT Next Visit Plan D/C to (I) HEP       Problem List Patient Active Problem List   Diagnosis Date Noted  . GERD (gastroesophageal reflux disease) 07/05/2014  . Dysphagia, pharyngoesophageal phase 07/05/2014  . Rectal bleeding 07/05/2014  . Palpitations 01/07/2014  . Atypical chest pain 01/07/2014  . Coronary atherosclerosis of native coronary artery 01/07/2014  . Mixed hyperlipidemia 03/11/2008  . DEPRESSION 03/11/2008  . Essential hypertension, benign 03/11/2008  . OSTEOPENIA 03/11/2008   PHYSICAL THERAPY DISCHARGE SUMMARY  Visits from Start of Care: 9  Current functional level related to goals / functional outcomes: Pt has met 2/3 STG, and 1/1 LTG.  See above for details.    Remaining deficits: Mild deficits in lower trap/lat strength of (B) LE, which was remaining STG.  Education provided on how to progress HEP to work on these areas.    Education / Equipment: HEP provided  for ROM, mobility, flexibility, and strength.  Plan: Patient agrees to discharge.  Patient goals were  met. for 3/4 total goals.  Patient is being discharged due to meeting the stated rehab goals.  ?????          Lonna Cobb, DPT [phone]

## 2014-08-03 ENCOUNTER — Encounter (HOSPITAL_COMMUNITY): Payer: Self-pay | Admitting: *Deleted

## 2014-08-03 ENCOUNTER — Ambulatory Visit (HOSPITAL_COMMUNITY)
Admission: RE | Admit: 2014-08-03 | Discharge: 2014-08-03 | Disposition: A | Discharge status: Home / Self Care | Payer: BLUE CROSS/BLUE SHIELD | Source: Ambulatory Visit | Attending: Internal Medicine | Admitting: Internal Medicine

## 2014-08-03 ENCOUNTER — Encounter (HOSPITAL_COMMUNITY)
Admission: RE | Disposition: A | Discharge status: Home / Self Care | Payer: Self-pay | Source: Ambulatory Visit | Attending: Internal Medicine

## 2014-08-03 DIAGNOSIS — R1310 Dysphagia, unspecified: Secondary | ICD-10-CM | POA: Insufficient documentation

## 2014-08-03 DIAGNOSIS — Z1211 Encounter for screening for malignant neoplasm of colon: Secondary | ICD-10-CM | POA: Insufficient documentation

## 2014-08-03 DIAGNOSIS — K6289 Other specified diseases of anus and rectum: Secondary | ICD-10-CM | POA: Diagnosis not present

## 2014-08-03 DIAGNOSIS — K219 Gastro-esophageal reflux disease without esophagitis: Secondary | ICD-10-CM

## 2014-08-03 DIAGNOSIS — M8580 Other specified disorders of bone density and structure, unspecified site: Secondary | ICD-10-CM | POA: Insufficient documentation

## 2014-08-03 DIAGNOSIS — K224 Dyskinesia of esophagus: Secondary | ICD-10-CM

## 2014-08-03 DIAGNOSIS — F329 Major depressive disorder, single episode, unspecified: Secondary | ICD-10-CM | POA: Diagnosis not present

## 2014-08-03 DIAGNOSIS — J312 Chronic pharyngitis: Secondary | ICD-10-CM | POA: Diagnosis not present

## 2014-08-03 DIAGNOSIS — I2510 Atherosclerotic heart disease of native coronary artery without angina pectoris: Secondary | ICD-10-CM | POA: Insufficient documentation

## 2014-08-03 DIAGNOSIS — K449 Diaphragmatic hernia without obstruction or gangrene: Secondary | ICD-10-CM | POA: Diagnosis not present

## 2014-08-03 DIAGNOSIS — I10 Essential (primary) hypertension: Secondary | ICD-10-CM | POA: Insufficient documentation

## 2014-08-03 DIAGNOSIS — D125 Benign neoplasm of sigmoid colon: Secondary | ICD-10-CM | POA: Insufficient documentation

## 2014-08-03 DIAGNOSIS — E785 Hyperlipidemia, unspecified: Secondary | ICD-10-CM | POA: Diagnosis not present

## 2014-08-03 DIAGNOSIS — R1311 Dysphagia, oral phase: Secondary | ICD-10-CM

## 2014-08-03 DIAGNOSIS — K644 Residual hemorrhoidal skin tags: Secondary | ICD-10-CM | POA: Diagnosis not present

## 2014-08-03 DIAGNOSIS — K625 Hemorrhage of anus and rectum: Secondary | ICD-10-CM

## 2014-08-03 DIAGNOSIS — K648 Other hemorrhoids: Secondary | ICD-10-CM

## 2014-08-03 DIAGNOSIS — Z7982 Long term (current) use of aspirin: Secondary | ICD-10-CM | POA: Diagnosis not present

## 2014-08-03 DIAGNOSIS — K6389 Other specified diseases of intestine: Secondary | ICD-10-CM

## 2014-08-03 HISTORY — PX: ESOPHAGOGASTRODUODENOSCOPY: SHX5428

## 2014-08-03 HISTORY — PX: COLONOSCOPY: SHX5424

## 2014-08-03 SURGERY — COLONOSCOPY
Anesthesia: Moderate Sedation

## 2014-08-03 MED ORDER — SODIUM CHLORIDE 0.9 % IV SOLN
INTRAVENOUS | Status: DC
  Administered 2014-08-03: 12:00:00 via INTRAVENOUS

## 2014-08-03 MED ORDER — HYDROCORTISONE ACETATE 25 MG RE SUPP: 25.0000 mg | Freq: Every day | RECTAL | Status: DC

## 2014-08-03 MED ORDER — MEPERIDINE HCL 50 MG/ML IJ SOLN
INTRAMUSCULAR | Status: AC
  Filled 2014-08-03: qty 1

## 2014-08-03 MED ORDER — MEPERIDINE HCL 50 MG/ML IJ SOLN
INTRAMUSCULAR | Status: DC | PRN
  Administered 2014-08-03 (×2): 25 mg via INTRAVENOUS

## 2014-08-03 MED ORDER — BUTAMBEN-TETRACAINE-BENZOCAINE 2-2-14 % EX AERO
INHALATION_SPRAY | CUTANEOUS | Status: DC | PRN
  Administered 2014-08-03: 2 via TOPICAL

## 2014-08-03 MED ORDER — MIDAZOLAM HCL 5 MG/5ML IJ SOLN
INTRAMUSCULAR | Status: DC | PRN
  Administered 2014-08-03 (×3): 2 mg via INTRAVENOUS
  Administered 2014-08-03 (×3): 1 mg via INTRAVENOUS

## 2014-08-03 MED ORDER — BENEFIBER DRINK MIX PO PACK: 4.0000 g | PACK | Freq: Every day | ORAL | Status: DC

## 2014-08-03 MED ORDER — MIDAZOLAM HCL 5 MG/5ML IJ SOLN
INTRAMUSCULAR | Status: AC
  Filled 2014-08-03: qty 10

## 2014-08-03 NOTE — H&P (Addendum)
Melinda Garza is an 58 y.o. female.   Chief Complaint: Patient is here for EGD, ED and colonoscopy. HPI: Patient is 58 year old Caucasian female who presents with over 5 month history of sore throat. ENT evaluation is been negative. She has not responded to pantoprazole twice a day schedule. She also has intermittent heartburn which has responded to this treatment. She denies hoarseness or chronic cough. She has continued with antibiotic as well as Flonase but no symptoms have not gone away completely. She has good appetite her weight is stable. She also has intermittent dysphagia primarily to solids. She denies abdominal pain change in bowel habits or frank bleeding. She has intermittent hematochezia felt to be secondary to hemorrhoids. Last colonoscopy was 10 years ago. Family history is negative for CRC.  Past Medical History  Diagnosis Date  . Depression   . Osteopenia   . Dyspareunia   . Essential hypertension, benign   . History of abnormal cervical Pap smear 1990  . History of hepatitis   . Hyperlipidemia   . CAD (coronary artery disease), native coronary artery     50% proximal RCA by heart catheterization 2004 [facility]    Past Surgical History  Procedure Laterality Date  . Appendectomy  1963  . Tubal ligation  1990  . Colposcopy  1985  . Basal cell carcinoma excision  2000    Removed from nose    Family History  Problem Relation Age of Onset  . Hypertension Mother   . Osteoporosis Mother   . Hyperlipidemia Mother   . Hypertension Father   . Heart attack Father   . Hyperlipidemia Father   . Hypertension Sister   . Breast cancer Other   . Hypertension Brother   . Stroke Maternal Grandmother   . Breast cancer Paternal Grandmother    Social History:  reports that she has never smoked. She has never used smokeless tobacco. She reports that she drinks about 1.8 oz of alcohol per week. She reports that she does not use illicit drugs.  Allergies:  Allergies  Allergen  Reactions  . Sulfonamide Derivatives Hives    Medications Prior to Admission  Medication Sig Dispense Refill  . aspirin 81 MG tablet Take 81 mg by mouth daily.    . ergocalciferol (VITAMIN D2) 50000 UNITS capsule Take 50,000 Units by mouth once a week.    . metoprolol succinate (TOPROL-XL) 25 MG 24 hr tablet Take 25 mg by mouth 2 (two) times daily.     . naproxen (NAPROSYN) 500 MG tablet Take 500 mg by mouth 2 (two) times daily with a meal. Only taking 1 month    . pantoprazole (PROTONIX) 40 MG tablet Take 40 mg by mouth 2 (two) times daily.    . polyethylene glycol-electrolytes (NULYTELY/GOLYTELY) 420 G solution Take 4,000 mLs by mouth once. 4000 mL 0  . pravastatin (PRAVACHOL) 10 MG tablet Take 10 mg by mouth daily.      No results found for this or any previous visit (from the past 48 hour(s)). No results found.  ROS  Blood pressure 147/87, pulse 58, temperature 97.6 F (36.4 C), temperature source Oral, resp. rate 15, last menstrual period 08/27/2003, SpO2 100 %. Physical Exam  Constitutional: She appears well-developed and well-nourished.  HENT:  Mouth/Throat: Oropharynx is clear and moist.  Eyes: Conjunctivae are normal. No scleral icterus.  Neck: No thyromegaly present.  Cardiovascular: Normal rate, regular rhythm and normal heart sounds.   No murmur heard. Respiratory: Effort normal.  GI:  Soft. She exhibits no distension and no mass. There is no tenderness.  Musculoskeletal: She exhibits no edema.  Lymphadenopathy:    She has no cervical adenopathy.  Neurological: She is alert.  Skin: Skin is warm and dry.     Assessment/Plan Solid food dysphagia and GERD. Chronic sore throat unresponsive to therapy.? GERD versus other. Intermittent hematochezia felt to be secondary to hemorrhoids. EGD with ED and average risk screening colonoscopy.   Larae Caison U 08/03/2014, 12:44 PM

## 2014-08-03 NOTE — Discharge Instructions (Signed)
Resume usual medications and diet. Keep Naproxen or Naprosyn use to minimum. Remember to take pantoprazole by mouth 30 minutes before breakfast and evening meal daily. No driving for 24 hours. Barium pill esophagogram to be scheduled. Office will call. Physician will call with biopsy results.   Esophagogastroduodenoscopy Care After Refer to this sheet in the next few weeks. These instructions provide you with information on caring for yourself after your procedure. Your caregiver may also give you more specific instructions. Your treatment has been planned according to current medical practices, but problems sometimes occur. Call your caregiver if you have any problems or questions after your procedure.  HOME CARE INSTRUCTIONS  Do not eat or drink anything until the numbing medicine (local anesthetic) has worn off and your gag reflex has returned. You will know that the local anesthetic has worn off when you can swallow comfortably.  Do not drive for 12 hours after the procedure or as directed by your caregiver.  Only take medicines as directed by your caregiver. SEEK MEDICAL CARE IF:   You cannot stop coughing.  You are not urinating at all or less than usual. SEEK IMMEDIATE MEDICAL CARE IF:  You have difficulty swallowing.  You cannot eat or drink.  You have worsening throat or chest pain.  You have dizziness, lightheadedness, or you faint.  You have nausea or vomiting.  You have chills.  You have a fever.  You have severe abdominal pain.  You have black, tarry, or bloody stools. Document Released: 07/29/2012 Document Reviewed: 07/29/2012 [facility] Patient Information 2015 [place]. This information is not intended to replace advice given to you by your health care provider. Make sure you discuss any questions you have with your health care provider. Colonoscopy, Care After Refer to this sheet in the next few weeks. These instructions provide you with information  on caring for yourself after your procedure. Your health care provider may also give you more specific instructions. Your treatment has been planned according to current medical practices, but problems sometimes occur. Call your health care provider if you have any problems or questions after your procedure. WHAT TO EXPECT AFTER THE PROCEDURE  After your procedure, it is typical to have the following:  A small amount of blood in your stool.  Moderate amounts of gas and mild abdominal cramping or bloating. HOME CARE INSTRUCTIONS  Do not drive, operate machinery, or sign important documents for 24 hours.  You may shower and resume your regular physical activities, but move at a slower pace for the first 24 hours.  Take frequent rest periods for the first 24 hours.  Walk around or put a warm pack on your abdomen to help reduce abdominal cramping and bloating.  Drink enough fluids to keep your urine clear or pale yellow.  You may resume your normal diet as instructed by your health care provider. Avoid heavy or fried foods that are hard to digest.  Avoid drinking alcohol for 24 hours or as instructed by your health care provider.  Only take over-the-counter or prescription medicines as directed by your health care provider.  If a tissue sample (biopsy) was taken during your procedure:  Do not take aspirin or blood thinners for 7 days, or as instructed by your health care provider.  Do not drink alcohol for 7 days, or as instructed by your health care provider.  Eat soft foods for the first 24 hours. SEEK MEDICAL CARE IF: You have persistent spotting of blood in your stool 2-3  days after the procedure. SEEK IMMEDIATE MEDICAL CARE IF:  You have more than a small spotting of blood in your stool.  You pass large blood clots in your stool.  Your abdomen is swollen (distended).  You have nausea or vomiting.  You have a fever.  You have increasing abdominal pain that is not  relieved with medicine. Document Released: 03/26/2004 Document Revised: 06/02/2013 Document Reviewed: 04/19/2013 [facility] Patient Information 2015 [place], [place]. This information is not intended to replace advice given to you by your health care provider. Make sure you discuss any questions you have with your health care provider.

## 2014-08-03 NOTE — Op Note (Signed)
EGD PROCEDURE REPORT  PATIENT:  Melinda Garza  MR#:  [id number] Birthdate:  [birth year], 58 y.o., female Endoscopist:  Dr. Rogene Houston, MD Referred By:  Dr. Glo Herring, MD  Procedure Date: 08/03/2014  Procedure:   EGD & Colonoscopy (ED not performed).  Indications:  Patient is 58 year old Caucasian female who has chronic sore throat. She also has heartburn. She's been on double dose PPI with improvement in her heartburn but not sore throat. ENT evaluation was negative. She is also complaining of intermittent solid food dysphagia. Patient is undergoing EGD possible esophageal dilation and average risk screening colonoscopy. She has intermittent hematochezia felt to be secondary to hemorrhoids.            Informed Consent:  The risks, benefits, alternatives & imponderables which include, but are not limited to, bleeding, infection, perforation, drug reaction and potential missed lesion have been reviewed.  The potential for biopsy, lesion removal, esophageal dilation, etc. have also been discussed.  Questions have been answered.  All parties agreeable.  Please see history & physical in medical record for more information.  Medications:  Demerol 50 mg IV Versed 9 mg IV Cetacaine spray topically for oropharyngeal anesthesia  EGD  Description of procedure:  The endoscope was introduced through the mouth and advanced to the second portion of the duodenum without difficulty or limitations. The mucosal surfaces were surveyed very carefully during advancement of the scope and upon withdrawal.  Findings:  Hypopharynx: Hypopharyngeal mucosa and mucosa at laryngeal inlet was unremarkable.   Very tortuous upper esophageal sphincter. Could not advanced regular scope was able to complete examination with Slim scope. Esophagus: Tortuous upper esophageal sphincter. Mucosa was normal throughout. GE junction was unremarkable without ring or stricture formation. GEJ:  38 cm Hiatus:  40 cm Stomach:   Stomach was empty and distended very well with insufflation. Folds in the proximal stomach were normal. Examination of mucosa at body, antrum, pyloric channel, angularis, fundus and cardia was normal. Duodenum:  Normal bulbar and post bulbar mucosa.  Therapeutic/Diagnostic Maneuvers Performed:  None  COLONOSCOPY Description of procedure:  After a digital rectal exam was performed, that colonoscope was advanced from the anus through the rectum and colon to the area of the cecum, ileocecal valve and appendiceal orifice. The cecum was deeply intubated. These structures were well-seen and photographed for the record. From the level of the cecum and ileocecal valve, the scope was slowly and cautiously withdrawn. The mucosal surfaces were carefully surveyed utilizing scope tip to flexion to facilitate fold flattening as needed. The scope was pulled down into the rectum where a thorough exam including retroflexion was performed.  Findings:   Prep excellent. 4 mm polyp cold snared from distal sigmoid colon. Prominent hemorrhoids noted low the dentate line along with two anal papillae.  Therapeutic/Diagnostic Maneuvers Performed:  See above  Complications:  None  Cecal Withdrawal Time:  9 minutes  Impression:  EGD findings; Tortuous upper esophageal sphincter. No evidence of erosive esophagitis ring or stricture formation. Small sliding hiatal hernia. No evidence of gastritis or peptic ulcer disease.  Colonoscopy findings; Examination performed to cecum. 4 mm polyp cold snare from distal sigmoid colon. Prominent external hemorrhoids and anal papillae.  Recommendations:  Patient advised to keep NSAID use to minimum. Barium pill esophagogram to be scheduled. Benefiber 4 g by mouth daily at bedtime. Anusol HC suppository 1 per rectum daily at bedtime for 2 weeks. I will be contacting patient with biopsy results.  Mani Celestin U  08/03/2014  1:41 PM  CC: Dr. Glo Herring., MD & Dr. Rayne Du  ref. provider found

## 2014-08-04 ENCOUNTER — Encounter (HOSPITAL_COMMUNITY): Payer: BC Managed Care – PPO | Admitting: Physical Therapy

## 2014-08-04 ENCOUNTER — Encounter (HOSPITAL_COMMUNITY): Payer: Self-pay | Admitting: Internal Medicine

## 2014-08-08 ENCOUNTER — Encounter (HOSPITAL_COMMUNITY): Payer: BC Managed Care – PPO | Admitting: Physical Therapy

## 2014-08-10 ENCOUNTER — Encounter (HOSPITAL_COMMUNITY): Payer: BC Managed Care – PPO

## 2014-08-11 ENCOUNTER — Other Ambulatory Visit (INDEPENDENT_AMBULATORY_CARE_PROVIDER_SITE_OTHER): Payer: Self-pay | Admitting: Internal Medicine

## 2014-08-11 ENCOUNTER — Other Ambulatory Visit (HOSPITAL_COMMUNITY): Payer: Self-pay | Admitting: Internal Medicine

## 2014-08-11 DIAGNOSIS — Z1231 Encounter for screening mammogram for malignant neoplasm of breast: Secondary | ICD-10-CM

## 2014-08-11 DIAGNOSIS — R1310 Dysphagia, unspecified: Secondary | ICD-10-CM

## 2014-08-11 DIAGNOSIS — K449 Diaphragmatic hernia without obstruction or gangrene: Secondary | ICD-10-CM

## 2014-08-11 DIAGNOSIS — J029 Acute pharyngitis, unspecified: Secondary | ICD-10-CM

## 2014-08-11 DIAGNOSIS — R12 Heartburn: Secondary | ICD-10-CM

## 2014-08-12 ENCOUNTER — Encounter (HOSPITAL_COMMUNITY): Payer: BC Managed Care – PPO | Admitting: Physical Therapy

## 2014-08-16 ENCOUNTER — Encounter (INDEPENDENT_AMBULATORY_CARE_PROVIDER_SITE_OTHER): Payer: Self-pay | Admitting: *Deleted

## 2014-08-16 ENCOUNTER — Ambulatory Visit (HOSPITAL_COMMUNITY)
Admission: RE | Admit: 2014-08-16 | Discharge: 2014-08-16 | Disposition: A | Discharge status: Home / Self Care | Payer: BC Managed Care – PPO | Source: Ambulatory Visit | Attending: Internal Medicine | Admitting: Internal Medicine

## 2014-08-16 DIAGNOSIS — J029 Acute pharyngitis, unspecified: Secondary | ICD-10-CM | POA: Diagnosis not present

## 2014-08-16 DIAGNOSIS — K449 Diaphragmatic hernia without obstruction or gangrene: Secondary | ICD-10-CM | POA: Diagnosis not present

## 2014-08-16 DIAGNOSIS — M6289 Other specified disorders of muscle: Secondary | ICD-10-CM | POA: Insufficient documentation

## 2014-08-16 DIAGNOSIS — K224 Dyskinesia of esophagus: Secondary | ICD-10-CM | POA: Diagnosis not present

## 2014-08-16 DIAGNOSIS — R12 Heartburn: Secondary | ICD-10-CM | POA: Diagnosis not present

## 2014-08-16 DIAGNOSIS — R1310 Dysphagia, unspecified: Secondary | ICD-10-CM

## 2014-08-16 DIAGNOSIS — R1319 Other dysphagia: Secondary | ICD-10-CM | POA: Insufficient documentation

## 2014-08-16 DIAGNOSIS — K225 Diverticulum of esophagus, acquired: Secondary | ICD-10-CM | POA: Insufficient documentation

## 2014-08-23 ENCOUNTER — Encounter (INDEPENDENT_AMBULATORY_CARE_PROVIDER_SITE_OTHER): Payer: Self-pay | Admitting: *Deleted

## 2014-08-23 ENCOUNTER — Other Ambulatory Visit (INDEPENDENT_AMBULATORY_CARE_PROVIDER_SITE_OTHER): Payer: Self-pay | Admitting: *Deleted

## 2014-08-23 DIAGNOSIS — R1310 Dysphagia, unspecified: Secondary | ICD-10-CM

## 2014-09-19 ENCOUNTER — Ambulatory Visit (HOSPITAL_COMMUNITY)
Admission: RE | Admit: 2014-09-19 | Discharge: 2014-09-19 | Disposition: A | Discharge status: Home / Self Care | Payer: BC Managed Care – PPO | Source: Ambulatory Visit | Attending: Internal Medicine | Admitting: Internal Medicine

## 2014-09-19 ENCOUNTER — Encounter (HOSPITAL_COMMUNITY): Payer: Self-pay

## 2014-09-19 ENCOUNTER — Ambulatory Visit (HOSPITAL_COMMUNITY): Payer: BC Managed Care – PPO

## 2014-09-19 ENCOUNTER — Other Ambulatory Visit (INDEPENDENT_AMBULATORY_CARE_PROVIDER_SITE_OTHER): Payer: Self-pay | Admitting: Internal Medicine

## 2014-09-19 ENCOUNTER — Encounter (HOSPITAL_COMMUNITY)
Admission: RE | Disposition: A | Discharge status: Home / Self Care | Payer: Self-pay | Source: Ambulatory Visit | Attending: Internal Medicine

## 2014-09-19 DIAGNOSIS — E782 Mixed hyperlipidemia: Secondary | ICD-10-CM | POA: Insufficient documentation

## 2014-09-19 DIAGNOSIS — Z882 Allergy status to sulfonamides status: Secondary | ICD-10-CM | POA: Insufficient documentation

## 2014-09-19 DIAGNOSIS — I2510 Atherosclerotic heart disease of native coronary artery without angina pectoris: Secondary | ICD-10-CM | POA: Insufficient documentation

## 2014-09-19 DIAGNOSIS — F329 Major depressive disorder, single episode, unspecified: Secondary | ICD-10-CM | POA: Diagnosis not present

## 2014-09-19 DIAGNOSIS — R1310 Dysphagia, unspecified: Secondary | ICD-10-CM

## 2014-09-19 DIAGNOSIS — K224 Dyskinesia of esophagus: Secondary | ICD-10-CM | POA: Insufficient documentation

## 2014-09-19 DIAGNOSIS — F17210 Nicotine dependence, cigarettes, uncomplicated: Secondary | ICD-10-CM | POA: Diagnosis not present

## 2014-09-19 DIAGNOSIS — Z85828 Personal history of other malignant neoplasm of skin: Secondary | ICD-10-CM | POA: Diagnosis not present

## 2014-09-19 DIAGNOSIS — K219 Gastro-esophageal reflux disease without esophagitis: Secondary | ICD-10-CM

## 2014-09-19 DIAGNOSIS — J029 Acute pharyngitis, unspecified: Secondary | ICD-10-CM | POA: Diagnosis not present

## 2014-09-19 DIAGNOSIS — K222 Esophageal obstruction: Secondary | ICD-10-CM

## 2014-09-19 DIAGNOSIS — Z7982 Long term (current) use of aspirin: Secondary | ICD-10-CM | POA: Diagnosis not present

## 2014-09-19 DIAGNOSIS — K449 Diaphragmatic hernia without obstruction or gangrene: Secondary | ICD-10-CM | POA: Insufficient documentation

## 2014-09-19 DIAGNOSIS — Z1231 Encounter for screening mammogram for malignant neoplasm of breast: Secondary | ICD-10-CM

## 2014-09-19 DIAGNOSIS — K228 Other specified diseases of esophagus: Secondary | ICD-10-CM

## 2014-09-19 DIAGNOSIS — I10 Essential (primary) hypertension: Secondary | ICD-10-CM | POA: Insufficient documentation

## 2014-09-19 DIAGNOSIS — M8580 Other specified disorders of bone density and structure, unspecified site: Secondary | ICD-10-CM | POA: Insufficient documentation

## 2014-09-19 HISTORY — PX: ESOPHAGOGASTRODUODENOSCOPY: SHX5428

## 2014-09-19 HISTORY — DX: Malignant (primary) neoplasm, unspecified: C80.1

## 2014-09-19 HISTORY — DX: Gastro-esophageal reflux disease without esophagitis: K21.9

## 2014-09-19 HISTORY — PX: SAVORY DILATION: SHX5439

## 2014-09-19 SURGERY — EGD (ESOPHAGOGASTRODUODENOSCOPY)
Anesthesia: Moderate Sedation

## 2014-09-19 MED ORDER — MEPERIDINE HCL 50 MG/ML IJ SOLN
INTRAMUSCULAR | Status: DC | PRN
  Administered 2014-09-19 (×2): 25 mg via INTRAVENOUS

## 2014-09-19 MED ORDER — MIDAZOLAM HCL 5 MG/5ML IJ SOLN
INTRAMUSCULAR | Status: DC | PRN
  Administered 2014-09-19 (×2): 2 mg via INTRAVENOUS
  Administered 2014-09-19: 1 mg via INTRAVENOUS
  Administered 2014-09-19 (×2): 2 mg via INTRAVENOUS

## 2014-09-19 MED ORDER — STERILE WATER FOR IRRIGATION IR SOLN
Status: DC | PRN
  Administered 2014-09-19: 08:00:00

## 2014-09-19 MED ORDER — MINERAL OIL PO OIL
TOPICAL_OIL | ORAL | Status: AC
  Filled 2014-09-19: qty 30

## 2014-09-19 MED ORDER — MIDAZOLAM HCL 5 MG/5ML IJ SOLN
INTRAMUSCULAR | Status: AC
  Filled 2014-09-19: qty 10

## 2014-09-19 MED ORDER — SODIUM CHLORIDE 0.9 % IV SOLN
INTRAVENOUS | Status: DC
  Administered 2014-09-19: 07:00:00 via INTRAVENOUS

## 2014-09-19 MED ORDER — MEPERIDINE HCL 50 MG/ML IJ SOLN
INTRAMUSCULAR | Status: AC
  Filled 2014-09-19: qty 1

## 2014-09-19 NOTE — H&P (Signed)
Melinda Garza is an 59 y.o. female.   Chief Complaint: Patient's here for EGD and ED. HPI: Patient is 59 year old Caucasian female who was chronic GERD. Heartburn has improved with therapy but her sore throat has not. She also has intermittent solid food dysphagia. She underwent EGD on 08/03/2014 and was noted have tortuous proximal esophagus. Therefore esophageal dilation was not attempted. She is returning for repeat dilation under fluoroscopy with Savary system. Patient also had barium pill study on 08/16/2014 which suggested esophageal dysmotility, Prominent cricopharyngeus and small traction diverticulum. Patient denies anorexia weight loss epigastric pain or melena. She also has difficulty swallowing pills.  Past Medical History  Diagnosis Date  . Depression   . Osteopenia   . Dyspareunia   . Essential hypertension, benign   . History of abnormal cervical Pap smear 1990  . History of hepatitis   . Hyperlipidemia   . CAD (coronary artery disease), native coronary artery     50% proximal RCA by heart catheterization 2004 - [facility]  . GERD (gastroesophageal reflux disease)   . Cancer     basal cell on nose.    Past Surgical History  Procedure Laterality Date  . Appendectomy  1963  . Tubal ligation  1990  . Colposcopy  1985  . Basal cell carcinoma excision  2000    Removed from nose  . Colonoscopy N/A 08/03/2014    Procedure: COLONOSCOPY;  Surgeon: Rogene Houston, MD;  [facility];  Service: Endoscopy;  Laterality: N/A;  100  . Esophagogastroduodenoscopy N/A 08/03/2014    Procedure: ESOPHAGOGASTRODUODENOSCOPY (EGD);  Surgeon: Rogene Houston, MD;  [facility];  Service: Endoscopy;  Laterality: N/A;    Family History  Problem Relation Age of Onset  . Hypertension Mother   . Osteoporosis Mother   . Hyperlipidemia Mother   . Hypertension Father   . Heart attack Father   . Hyperlipidemia Father   . Hypertension Sister   . Breast cancer Other   .  Hypertension Brother   . Stroke Maternal Grandmother   . Breast cancer Paternal Grandmother    Social History:  reports that she has been smoking Cigarettes.  She has a .125 pack-year smoking history. She has never used smokeless tobacco. She reports that she drinks about 1.8 oz of alcohol per week. She reports that she does not use illicit drugs.  Allergies:  Allergies  Allergen Reactions  . Sulfonamide Derivatives Hives    Medications Prior to Admission  Medication Sig Dispense Refill  . aspirin 81 MG tablet Take 81 mg by mouth daily.    Marland Kitchen CALCIUM-MAGNESIUM PO Take 15 mLs by mouth daily.    . cholecalciferol (VITAMIN D) 1000 UNITS tablet Take 1,000 Units by mouth daily.    Marland Kitchen co-enzyme Q-10 30 MG capsule Take 30 mg by mouth daily.    . metoprolol tartrate (LOPRESSOR) 25 MG tablet Take 25 mg by mouth 2 (two) times daily.    . pantoprazole (PROTONIX) 40 MG tablet Take 40 mg by mouth 2 (two) times daily.    . pravastatin (PRAVACHOL) 10 MG tablet Take 10 mg by mouth daily.    . Probiotic Product (PROBIOTIC DAILY PO) Take 1 capsule by mouth daily.    . vitamin C (ASCORBIC ACID) 500 MG tablet Take 500 mg by mouth daily.    . hydrocortisone (ANUSOL-HC) 25 MG suppository Place 1 suppository (25 mg total) rectally at bedtime. (Patient not taking: Reported on 09/08/2014) 14 suppository 1  No results found for this or any previous visit (from the past 48 hour(s)). No results found.  ROS  Blood pressure 120/82, pulse 60, temperature 97.6 F (36.4 C), temperature source Oral, resp. rate 18, height 5\' 5"  (1.651 m), weight 147 lb (66.679 kg), last menstrual period 08/27/2003, SpO2 95 %. Physical Exam  Constitutional: She appears well-developed and well-nourished.  HENT:  Mouth/Throat: Oropharynx is clear and moist.  Eyes: Conjunctivae are normal. No scleral icterus.  Neck: No thyromegaly present.  Cardiovascular: Normal rate, regular rhythm and normal heart sounds.   No murmur  heard. Respiratory: Effort normal and breath sounds normal.  GI: Soft. She exhibits no distension and no mass. There is no tenderness.  Musculoskeletal: She exhibits no edema.  Lymphadenopathy:    She has no cervical adenopathy.  Neurological: She is alert.  Skin: Skin is warm.     Assessment/Plan Solid food dysphagia. GERD. EGD with ED under fluoroscopy.  REHMAN,NAJEEB U 09/19/2014, 7:33 AM

## 2014-09-19 NOTE — Op Note (Signed)
EGD PROCEDURE REPORT  PATIENT:  Melinda Garza  MR#:  [id number] Birthdate:  12/07/1955, 59 y.o., female Endoscopist:  Dr. Rogene Houston, MD Referred By:  Dr. Glo Herring, MD  Procedure Date: 09/19/2014  Procedure:   EGD with ED under fluoroscopy.  Indications:  Patient is 60 year old Caucasian female who presents with intermittent solid food dysphagia. She also has GERD. Heartburns well controlled with therapy but she remains with sore throat. She underwent EGD on 08/03/2014. Proximal end of esophagus was tortuous. Therefore I elected not to dilate dilated esophagus blindly. She had barium pill esophagogram is suggested esophageal dysmotility small traction diverticulum in prominent cricopharyngeus. She is returning for repeat EGD with dilation under fluoroscopy.            Informed Consent:  The risks, benefits, alternatives & imponderables which include, but are not limited to, bleeding, infection, perforation, drug reaction and potential missed lesion have been reviewed.  The potential for biopsy, lesion removal, esophageal dilation, etc. have also been discussed.  Questions have been answered.  All parties agreeable.  Please see history & physical in medical record for more information.  Medications:  Demerol 50 mg IV Versed 9 mg IV Cetacaine spray topically for oropharyngeal anesthesia  Description of procedure:  The endoscope was introduced through the mouth and advanced to the second portion of the duodenum without difficulty or limitations. The mucosal surfaces were surveyed very carefully during advancement of the scope and upon withdrawal. Slim scope was used for the procedure.  Findings:  Esophagus:  Proximal segment appeared to be somewhat tortuous. Mucosa was normal throughout. GE junction was unremarkable. GEJ:  35 cm Hiatus:  37 cm Stomach:  Stomach was empty other than small pale at gastric body. Folds in the proximal stomach were normal. Examination of mucosa at body,  antrum, pyloric channel, angularis fundus and cardia was normal. Duodenum:  Normal bulbar and post bulbar mucosa.  Therapeutic/Diagnostic Maneuvers Performed:   Savary guidewire was advanced through the scope into gastric lumen. Endoscope was withdrawn leaving the wire in place. Wire position was confirmed fluoroscopically. 15 mm and 59mm Savary dilators were passed over the guidewire under fluoroscopic control to full insertion. Guidewire was removed. Endoscope was passed again and no mucosal disruption noted. Subsequently esophagus was dilated by passing 54 [place] or 18 mm Maloney dilator under fluoroscopic control to full insertion. As the dilator was withdrawn endoscope was passed again and linear mucosal disruption noted just below upper esophageal sphincter.  Complications:  None  Impression: Somewhat tortuous segment of proximal esophagus. Small sliding hiatal hernia without evidence of ring or stricture formation. Esophagus initially dilated by passing 15 and 16 mm Savary dilators over guidewire under fluoroscopic control. Esophagus subsequently  dilated by passing 45 [place] or 18 mm Maloney dilator under fluoroscopic control resulting in linear mucosal disruption indicated of a web.  Recommendations:  Standard instructions given. Patient advised to call office with progress report later this week. If she remains with dysphagia will proceed with esophageal manometry.   Kymberlie Brazeau U  09/19/2014  8:25 AM  CC: Dr. Glo Herring., MD & Dr. Rayne Du ref. provider found

## 2014-09-19 NOTE — Discharge Instructions (Signed)
No aspirin for 3 days; resume other medications as before. Resume usual diet. No driving for 24 hours. Please call office with progress report later this week.   Esophagogastroduodenoscopy Care After Refer to this sheet in the next few weeks. These instructions provide you with information on caring for yourself after your procedure. Your caregiver may also give you more specific instructions. Your treatment has been planned according to current medical practices, but problems sometimes occur. Call your caregiver if you have any problems or questions after your procedure.  HOME CARE INSTRUCTIONS  Do not eat or drink anything until the numbing medicine (local anesthetic) has worn off and your gag reflex has returned. You will know that the local anesthetic has worn off when you can swallow comfortably.  Do not drive for 12 hours after the procedure or as directed by your caregiver.  Only take medicines as directed by your caregiver. SEEK MEDICAL CARE IF:   You cannot stop coughing.  You are not urinating at all or less than usual. SEEK IMMEDIATE MEDICAL CARE IF:  You have difficulty swallowing.  You cannot eat or drink.  You have worsening throat or chest pain.  You have dizziness, lightheadedness, or you faint.  You have nausea or vomiting.  You have chills.  You have a fever.  You have severe abdominal pain.  You have black, tarry, or bloody stools. Document Released: 07/29/2012 Document Reviewed: 07/29/2012 [facility] Patient Information 2015 [place]. This information is not intended to replace advice given to you by your health care provider. Make sure you discuss any questions you have with your health care provider.

## 2014-09-21 ENCOUNTER — Encounter (HOSPITAL_COMMUNITY): Payer: Self-pay | Admitting: Internal Medicine

## 2014-09-23 ENCOUNTER — Telehealth (INDEPENDENT_AMBULATORY_CARE_PROVIDER_SITE_OTHER): Payer: Self-pay | Admitting: *Deleted

## 2014-09-23 NOTE — Telephone Encounter (Signed)
Forwarded to Dr.Rehman for review. 

## 2014-09-23 NOTE — Telephone Encounter (Signed)
Progress Report - Had an EGD on 09/19/14, [place]acio said it is easier to swallow pills but feels like she is having spasms in her esophagus. Still having the clicking noise in her throat if she doesn't stretch her neck to swallow and burping. Her return phone number is [phone]

## 2014-09-30 NOTE — Telephone Encounter (Signed)
Patient's call returned. She states her swallowing is 75% better. She has clicking noise when she swallows but there is no pain associated with it. Since her swallowing has improved great deal will continue to monitor and plan to see her back in the office in 3 months unless symptoms progress.

## 2014-09-30 NOTE — Telephone Encounter (Signed)
Ellyana said she missed your call and would like to see if you call her back. She has some questions to ask. The return phone number is [phone].

## 2014-12-01 ENCOUNTER — Ambulatory Visit (HOSPITAL_COMMUNITY)
Admission: RE | Admit: 2014-12-01 | Discharge: 2014-12-01 | Disposition: A | Discharge status: Home / Self Care | Payer: BC Managed Care – PPO | Source: Ambulatory Visit | Attending: Family Medicine | Admitting: Family Medicine

## 2014-12-01 ENCOUNTER — Other Ambulatory Visit (HOSPITAL_COMMUNITY): Payer: Self-pay | Admitting: Family Medicine

## 2014-12-01 DIAGNOSIS — R928 Other abnormal and inconclusive findings on diagnostic imaging of breast: Secondary | ICD-10-CM | POA: Diagnosis not present

## 2014-12-01 DIAGNOSIS — W19XXXA Unspecified fall, initial encounter: Secondary | ICD-10-CM | POA: Diagnosis not present

## 2014-12-01 DIAGNOSIS — S2341XA Sprain of ribs, initial encounter: Secondary | ICD-10-CM

## 2014-12-01 DIAGNOSIS — R079 Chest pain, unspecified: Secondary | ICD-10-CM | POA: Diagnosis present

## 2014-12-28 ENCOUNTER — Ambulatory Visit: Payer: BC Managed Care – PPO | Admitting: Obstetrics and Gynecology

## 2014-12-29 ENCOUNTER — Ambulatory Visit (INDEPENDENT_AMBULATORY_CARE_PROVIDER_SITE_OTHER): Payer: BC Managed Care – PPO | Admitting: Obstetrics and Gynecology

## 2014-12-29 ENCOUNTER — Encounter: Payer: Self-pay | Admitting: Obstetrics and Gynecology

## 2014-12-29 VITALS — BP 98/74 | HR 82 | Resp 16 | Ht 65.0 in | Wt 145.0 lb

## 2014-12-29 DIAGNOSIS — Z0000 Encounter for general adult medical examination without abnormal findings: Secondary | ICD-10-CM

## 2014-12-29 DIAGNOSIS — Z01419 Encounter for gynecological examination (general) (routine) without abnormal findings: Secondary | ICD-10-CM | POA: Diagnosis not present

## 2014-12-29 LAB — POCT URINALYSIS DIPSTICK
Leukocytes, UA: NEGATIVE
PH UA: 5
UROBILINOGEN UA: NEGATIVE

## 2014-12-29 NOTE — Progress Notes (Signed)
59 y.o. G51P2002 Married Caucasian female here for annual exam.    Had esophageal dilation.   Considering breast reduction.  Seeing Dr. Burton Apley.   Will do labs with Dr. [name] next Friday.   Just retired September 26, 2014.  Daughter just had a baby.   PCP:   Redmond School, MD  Patient's last menstrual period was 08/27/2003.          Sexually active: Yes.    The current method of family planning is tubal ligation and post menopausal status.    Exercising: Yes.    walking, mowing yard (pushes mower) 5x/wk  Smoker:  no  Health Maintenance: Pap:  12/10/13 wnl neg HR HPV History of abnormal Pap:  Yes, 30 years ago had colpo bx for abnl pap f/u papsmears normal MMG:  09/21/14 bi-rads : negative Colonoscopy:  08/2014 1 polyp f/u in 2026 BMD:   03/16/14  Result: Osteopenia  TDaP:  11/19/12 Screening Labs:  Hb today: no, Urine today: Negative   reports that she has been smoking Cigarettes.  She has a .125 pack-year smoking history. She has never used smokeless tobacco. She reports that she drinks about 1.8 oz of alcohol per week. She reports that she does not use illicit drugs.  Past Medical History  Diagnosis Date  . Depression   . Osteopenia   . Dyspareunia   . Essential hypertension, benign   . History of abnormal cervical Pap smear 1990  . History of hepatitis   . Hyperlipidemia   . CAD (coronary artery disease), native coronary artery     50% proximal RCA by heart catheterization 2004 - [facility]  . GERD (gastroesophageal reflux disease)   . Cancer     basal cell on nose.    Past Surgical History  Procedure Laterality Date  . Appendectomy  1963  . Tubal ligation  1990  . Colposcopy  1985  . Basal cell carcinoma excision  2000    Removed from nose  . Colonoscopy N/A 08/03/2014    Procedure: COLONOSCOPY;  Surgeon: Rogene Houston, MD;  [facility];  Service: Endoscopy;  Laterality: N/A;  100  . Esophagogastroduodenoscopy N/A 08/03/2014    Procedure:  ESOPHAGOGASTRODUODENOSCOPY (EGD);  Surgeon: Rogene Houston, MD;  [facility];  Service: Endoscopy;  Laterality: N/A;  . Esophagogastroduodenoscopy N/A 09/19/2014    Procedure: ESOPHAGOGASTRODUODENOSCOPY (EGD);  Surgeon: Rogene Houston, MD;  [facility];  Service: Endoscopy;  Laterality: N/A;  730 under fluoro in OR  . Savory dilation N/A 09/19/2014    Procedure: SAVORY DILATION;  Surgeon: Rogene Houston, MD;  [facility];  Service: Endoscopy;  Laterality: N/A;    Current Outpatient Prescriptions  Medication Sig Dispense Refill  . CALCIUM-MAGNESIUM PO Take 15 mLs by mouth daily.    . cholecalciferol (VITAMIN D) 1000 UNITS tablet Take 1,000 Units by mouth daily.    Marland Kitchen co-enzyme Q-10 30 MG capsule Take 30 mg by mouth daily.    . hydrocortisone (ANUSOL-HC) 25 MG suppository Place 1 suppository (25 mg total) rectally at bedtime. (Patient not taking: Reported on 09/08/2014) 14 suppository 1  . metoprolol tartrate (LOPRESSOR) 25 MG tablet Take 25 mg by mouth 2 (two) times daily.    . pantoprazole (PROTONIX) 40 MG tablet Take 40 mg by mouth 2 (two) times daily.    . pravastatin (PRAVACHOL) 10 MG tablet Take 10 mg by mouth daily.    . Probiotic Product (PROBIOTIC DAILY PO) Take 1  capsule by mouth daily.    . vitamin C (ASCORBIC ACID) 500 MG tablet Take 500 mg by mouth daily.     No current facility-administered medications for this visit.    Family History  Problem Relation Age of Onset  . Hypertension Mother   . Osteoporosis Mother   . Hyperlipidemia Mother   . Hypertension Father   . Heart attack Father   . Hyperlipidemia Father   . Hypertension Sister   . Breast cancer Other   . Hypertension Brother   . Stroke Maternal Grandmother   . Breast cancer Paternal Grandmother     ROS:  Pertinent items are noted in HPI.  Otherwise, a comprehensive ROS was negative.  Exam:   BP 98/74 mmHg  Pulse 82  Resp 16  Ht 5\' 5"  (1.651 m)  Wt 145 lb (65.772 kg)   BMI 24.13 kg/m2  LMP 08/27/2003    General appearance: alert, cooperative and appears stated age Head: Normocephalic, without obvious abnormality, atraumatic Neck: no adenopathy, supple, symmetrical, trachea midline and thyroid normal to inspection and palpation Lungs: clear to auscultation bilaterally Breasts: normal appearance, no masses or tenderness, Inspection negative, No nipple retraction or dimpling, No nipple discharge or bleeding, No axillary or supraclavicular adenopathy Heart: regular rate and rhythm Abdomen: soft, non-tender; bowel sounds normal; no masses,  no organomegaly Extremities: extremities normal, atraumatic, no cyanosis or edema Skin: Skin color, texture, turgor normal. No rashes or lesions Lymph nodes: Cervical, supraclavicular, and axillary nodes normal. No abnormal inguinal nodes palpated Neurologic: Grossly normal  Pelvic: External genitalia:  no lesions              Urethra:  normal appearing urethra with no masses, tenderness or lesions              Bartholins and Skenes: normal                 Vagina: normal appearing vagina with normal color and discharge, no lesions              Cervix: no lesions              Pap taken: No. Bimanual Exam:  Uterus:  normal size, contour, position, consistency, mobility, non-tender              Adnexa: normal adnexa and no mass, fullness, tenderness              Rectovaginal: Yes.  .  Confirms.              Anus:  normal sphincter tone,  Hemorrhoids noted.  Chaperone was present for exam.  Assessment:   Well woman visit with normal exam. Remote history of abnormal pap.  Osteopenia.   Plan: Yearly mammogram recommended after age 52.  Recommended self breast exam.  Pap and HR HPV as above. Discussed Calcium, Vitamin D, regular exercise program including cardiovascular and weight bearing exercise. Bone density next year.  Labs performed.  No..   See orders. Refills given on medications.  No..  See orders. Follow up  annually and prn.      After visit summary provided.

## 2014-12-29 NOTE — Patient Instructions (Signed)

## 2015-01-12 ENCOUNTER — Encounter: Payer: Self-pay | Admitting: Cardiology

## 2015-01-12 ENCOUNTER — Ambulatory Visit (INDEPENDENT_AMBULATORY_CARE_PROVIDER_SITE_OTHER): Payer: BC Managed Care – PPO | Admitting: Cardiology

## 2015-01-12 VITALS — BP 118/68 | HR 68 | Ht 65.5 in | Wt 145.0 lb

## 2015-01-12 DIAGNOSIS — R002 Palpitations: Secondary | ICD-10-CM | POA: Diagnosis not present

## 2015-01-12 DIAGNOSIS — Z136 Encounter for screening for cardiovascular disorders: Secondary | ICD-10-CM

## 2015-01-12 NOTE — Patient Instructions (Signed)
Your physician wants you to follow-up in: 1 year with Dr. McDowell You will receive a reminder letter in the mail two months in advance. If you don't receive a letter, please call our office to schedule the follow-up appointment.  Your physician recommends that you continue on your current medications as directed. Please refer to the Current Medication list given to you today.  Thank you for choosing [facility]!!   

## 2015-01-12 NOTE — Progress Notes (Signed)
Cardiology Office Note  Date: 01/12/2015   ID: Melinda Garza, DOB 03-03-1956, MRN [id number]  PCP: Glo Herring., MD  Primary Cardiologist: Rozann Lesches, MD   Chief Complaint  Patient presents with  . Coronary Artery Disease  . History of palpitations    History of Present Illness: Melinda Garza is a 59 y.o. female last seen in November 2015. She presents for a routine cardiac follow-up. She has been doing well since I last saw her, states that she has retired, has enjoyed her retirement very much so far doing things with her family. She does not report any significant palpitations on low-dose beta blocker.  Follow-up ECG today is normal.  48 hour Holter monitor obtained in June 2015 showed sinus rhythm with PVCs documented, one 4 beat burst noted. Beta blocker dose was increased and no further testing pursued at that time.  We did review her medications, she states that Pravachol dose was just increased by Dr. [name].  Past Medical History  Diagnosis Date  . Depression   . Osteopenia   . Dyspareunia   . Essential hypertension, benign   . History of abnormal cervical Pap smear 1990  . History of hepatitis   . Hyperlipidemia   . CAD (coronary artery disease), native coronary artery     50% proximal RCA by heart catheterization 2004 - [facility]  . GERD (gastroesophageal reflux disease)   . Cancer     Basal cell on nose.     Current Outpatient Prescriptions  Medication Sig Dispense Refill  . CALCIUM-MAGNESIUM PO Take 15 mLs by mouth daily.    . metoprolol tartrate (LOPRESSOR) 25 MG tablet Take 25 mg by mouth 2 (two) times daily.    . pantoprazole (PROTONIX) 40 MG tablet Take 40 mg by mouth 2 (two) times daily.    . pravastatin (PRAVACHOL) 20 MG tablet     . Probiotic Product (PROBIOTIC DAILY PO) Take 1 capsule by mouth daily.    Marland Kitchen UBIQUINOL PO Take 20 mg by mouth.    . valACYclovir (VALTREX) 1000 MG tablet as needed.    . vitamin C (ASCORBIC ACID) 500 MG tablet Take  500 mg by mouth daily.     No current facility-administered medications for this visit.    Allergies:  Sulfonamide derivatives   Social History: The patient  reports that she quit smoking about 17 years ago. Her smoking use included Cigarettes. She started smoking about 17 years ago. She has a .125 pack-year smoking history. She has never used smokeless tobacco. She reports that she drinks alcohol. She reports that she does not use illicit drugs.   ROS:  Please see the history of present illness. Otherwise, complete review of systems is positive for none.  All other systems are reviewed and negative.   Physical Exam: VS:  BP 118/68 mmHg  Pulse 68  Ht 5' 5.5" (1.664 m)  Wt 145 lb (65.772 kg)  BMI 23.75 kg/m2  SpO2 99%  LMP 08/27/2003, BMI Body mass index is 23.75 kg/(m^2).  Wt Readings from Last 3 Encounters:  01/12/15 145 lb (65.772 kg)  12/29/14 145 lb (65.772 kg)  09/19/14 147 lb (66.679 kg)     Normally nourished appearing woman, comfortable at rest. HEENT: Conjunctiva and lids normal, oropharynx clear. Neck: Supple, no elevated JVP or carotid bruits, no thyromegaly. Lungs: Clear to auscultation, nonlabored breathing at rest. Cardiac: Regular rate and rhythm, no S3 or significant systolic murmur, no pericardial rub. Abdomen: Soft, nontender, bowel sounds  present. Extremities: No pitting edema, distal pulses 2+.   ECG: ECG is ordered today and shows normal sinus rhythm.   Other Studies Reviewed Today:  Echocardiogram from March 2014 revealed mild LVH with mildly reduced chamber size, LVEF 55-60% without regional wall motion abnormalities, normal diastolic function, mild mitral regurgitation.  ASSESSMENT AND PLAN:  1. History of palpitations and previously documented PVCs, currently quiescent on low-dose beta blocker. I encouraged regular exercise plan, and we will see her back for follow-up in one year.  2. Hyperlipidemia, on Pravachol. Will request most recent lab  work from Dr. [name].  Current medicines were reviewed at length with the patient today.   Disposition: FU with me in 1 year.   Signed, Satira Sark, MD, [facility] 01/12/2015 11:05 AM    [facility]. [address], [place], [place] [address] Phone: [phone]; Fax: [phone]

## 2015-03-10 ENCOUNTER — Encounter: Payer: Self-pay | Admitting: Cardiovascular Disease

## 2015-03-23 ENCOUNTER — Encounter: Payer: Self-pay | Admitting: Cardiovascular Disease

## 2015-04-27 HISTORY — PX: BREAST SURGERY: SHX581

## 2015-05-23 ENCOUNTER — Other Ambulatory Visit: Payer: Self-pay | Admitting: Plastic Surgery

## 2015-06-05 ENCOUNTER — Other Ambulatory Visit (HOSPITAL_COMMUNITY): Payer: Self-pay | Admitting: Plastic Surgery

## 2015-06-05 ENCOUNTER — Encounter (HOSPITAL_COMMUNITY): Payer: Self-pay

## 2015-06-05 ENCOUNTER — Ambulatory Visit (HOSPITAL_COMMUNITY)
Admission: RE | Admit: 2015-06-05 | Discharge: 2015-06-05 | Disposition: A | Discharge status: Home / Self Care | Payer: BC Managed Care – PPO | Source: Ambulatory Visit | Attending: Plastic Surgery | Admitting: Plastic Surgery

## 2015-06-05 DIAGNOSIS — R079 Chest pain, unspecified: Secondary | ICD-10-CM

## 2015-06-05 DIAGNOSIS — Z9889 Other specified postprocedural states: Secondary | ICD-10-CM | POA: Diagnosis not present

## 2015-06-05 DIAGNOSIS — J9811 Atelectasis: Secondary | ICD-10-CM | POA: Diagnosis not present

## 2015-06-05 DIAGNOSIS — R0602 Shortness of breath: Secondary | ICD-10-CM | POA: Diagnosis not present

## 2015-06-05 DIAGNOSIS — R069 Unspecified abnormalities of breathing: Secondary | ICD-10-CM

## 2015-06-05 LAB — POCT I-STAT CREATININE: Creatinine, Ser: 0.7 mg/dL (ref 0.44–1.00)

## 2015-06-05 MED ORDER — IOHEXOL 350 MG/ML SOLN
75.0000 mL | Freq: Once | INTRAVENOUS | Status: AC | PRN
  Administered 2015-06-05: 75 mL via INTRAVENOUS

## 2015-10-19 ENCOUNTER — Other Ambulatory Visit (HOSPITAL_COMMUNITY): Payer: Self-pay | Admitting: Internal Medicine

## 2015-10-19 DIAGNOSIS — Z1231 Encounter for screening mammogram for malignant neoplasm of breast: Secondary | ICD-10-CM

## 2015-11-27 ENCOUNTER — Ambulatory Visit (HOSPITAL_COMMUNITY)
Admission: RE | Admit: 2015-11-27 | Discharge: 2015-11-27 | Disposition: A | Discharge status: Home / Self Care | Payer: BC Managed Care – PPO | Source: Ambulatory Visit | Attending: Internal Medicine | Admitting: Internal Medicine

## 2015-11-27 DIAGNOSIS — Z1231 Encounter for screening mammogram for malignant neoplasm of breast: Secondary | ICD-10-CM | POA: Diagnosis present

## 2016-01-12 ENCOUNTER — Ambulatory Visit (INDEPENDENT_AMBULATORY_CARE_PROVIDER_SITE_OTHER): Payer: BC Managed Care – PPO | Admitting: Obstetrics and Gynecology

## 2016-01-12 ENCOUNTER — Encounter: Payer: Self-pay | Admitting: Obstetrics and Gynecology

## 2016-01-12 VITALS — BP 110/70 | HR 56 | Resp 18 | Ht 65.0 in | Wt 136.4 lb

## 2016-01-12 DIAGNOSIS — M8580 Other specified disorders of bone density and structure, unspecified site: Secondary | ICD-10-CM | POA: Diagnosis not present

## 2016-01-12 DIAGNOSIS — Z01419 Encounter for gynecological examination (general) (routine) without abnormal findings: Secondary | ICD-10-CM | POA: Diagnosis not present

## 2016-01-12 NOTE — Progress Notes (Signed)
Patient ID: Melinda Garza, female   DOB: Jul 20, 1956, 60 y.o.   MRN: [id number] 60 y.o. [id number] Married Caucasian female here for annual exam.    Wants a pap today.   Happy with breast reduction surgery.   Taking some vit D OTC.   Just finished Cipro for UTI.  This was asymptomatic.   Sees cardiology once a year for check ups.   PCP:   Redmond School, MD  Patient's last menstrual period was 08/27/2003.           Sexually active: Yes.   female The current method of family planning is tubal ligation.    Exercising: Yes.    P90X Smoker:  Former, quit [place] Maintenance: Pap:  12-10-13 Neg:Neg HR HPV History of abnormal Pap:  Yes, 1985 colposcopy w/biopsy but no treatment. MMG:  11-27-15 Density B/Neg/BiRads1:[place] Colonoscopy:  08/2014 polyp Dr. Brett Canales due 08/2024 BMD:   03-16-14  Result  Osteopenia:[place] TDaP:  11-19-12 Gardasil:   N/A Hep C:  Will check with PCP. Screening Labs:  Hb today: PCP, Urine today: PCP   reports that she quit smoking about 18 years ago. Her smoking use included Cigarettes. She started smoking about 18 years ago. She has a .125 pack-year smoking history. She has never used smokeless tobacco. She reports that she drinks about 1.8 oz of alcohol per week. She reports that she does not use illicit drugs.  Past Medical History  Diagnosis Date  . Depression   . Osteopenia   . Dyspareunia   . Essential hypertension, benign   . History of abnormal cervical Pap smear 1990  . History of hepatitis   . Hyperlipidemia   . CAD (coronary artery disease), native coronary artery     50% proximal RCA by heart catheterization 2004 - [facility]  . GERD (gastroesophageal reflux disease)   . Cancer (HCC)     Basal cell on nose.    Past Surgical History  Procedure Laterality Date  . Appendectomy  1963  . Tubal ligation  1990  . Colposcopy  1985  . Basal cell carcinoma excision  2000    Removed from nose  . Colonoscopy N/A 08/03/2014    Procedure:  COLONOSCOPY;  Surgeon: Rogene Houston, MD;  [facility];  Service: Endoscopy;  Laterality: N/A;  100  . Esophagogastroduodenoscopy N/A 08/03/2014    Procedure: ESOPHAGOGASTRODUODENOSCOPY (EGD);  Surgeon: Rogene Houston, MD;  Location: [facility];  Service: Endoscopy;  Laterality: N/A;  . Esophagogastroduodenoscopy N/A 09/19/2014    Procedure: ESOPHAGOGASTRODUODENOSCOPY (EGD);  Surgeon: Rogene Houston, MD;  [facility]: [facility];  Service: Endoscopy;  Laterality: N/A;  730 under fluoro in OR  . Savory dilation N/A 09/19/2014    Procedure: SAVORY DILATION;  Surgeon: Rogene Houston, MD;  Location: [facility];  Service: Endoscopy;  Laterality: N/A;  . Breast surgery  04/2015    breast reduction--David Exie Parody, MD    Current Outpatient Prescriptions  Medication Sig Dispense Refill  . FLUoxetine (PROZAC) 10 MG capsule Take 1 capsule by mouth daily.    . metoprolol tartrate (LOPRESSOR) 25 MG tablet Take 25 mg by mouth 2 (two) times daily.    . Multiple Vitamin (MULTIVITAMIN) capsule Take 1 capsule by mouth daily.    . Omega-3 1000 MG CAPS Take 1 g by mouth daily.    . pravastatin (PRAVACHOL) 20 MG tablet     . Probiotic Product (PROBIOTIC DAILY PO) Take 1 capsule by mouth  daily.    Marland Kitchen UBIQUINOL PO Take 20 mg by mouth.    . valACYclovir (VALTREX) 500 MG tablet Take 1 tablet by mouth as needed.     No current facility-administered medications for this visit.    Family History  Problem Relation Age of Onset  . Hypertension Mother   . Osteoporosis Mother   . Hyperlipidemia Mother   . Hypertension Father   . Heart attack Father   . Hyperlipidemia Father   . Hypertension Sister   . Breast cancer Other   . Hypertension Brother   . Stroke Maternal Grandmother   . Breast cancer Paternal Grandmother     ROS:  Pertinent items are noted in HPI.  Otherwise, a comprehensive ROS was negative.  Exam:   BP 110/70 mmHg  Pulse 56  Resp 18  Ht 5\' 5"  (1.651 m)  Wt 136 lb 6.4 oz  (61.871 kg)  BMI 22.70 kg/m2  LMP 08/27/2003    General appearance: alert, cooperative and appears stated age Head: Normocephalic, without obvious abnormality, atraumatic Neck: no adenopathy, supple, symmetrical, trachea midline and thyroid normal to inspection and palpation Lungs: clear to auscultation bilaterally Breasts: normal appearance, no masses or tenderness, Inspection negative, No nipple retraction or dimpling, No nipple discharge or bleeding, No axillary or supraclavicular adenopathy.  Consistent with bilateral breast reduction.  Heart: regular rate and rhythm Abdomen: incisions:  Yes.    Right lower quadrant oblique and umbilical inicisions , soft, non-tender; no masses, no organomegaly Extremities: extremities normal, atraumatic, no cyanosis or edema Skin: Skin color, texture, turgor normal. No rashes or lesions Lymph nodes: Cervical, supraclavicular, and axillary nodes normal. No abnormal inguinal nodes palpated Neurologic: Grossly normal  Pelvic: External genitalia:  no lesions              Urethra:  normal appearing urethra with no masses, tenderness or lesions              Bartholins and Skenes: normal                 Vagina: normal appearing vagina with normal color and discharge, no lesions              Cervix: no lesions              Pap taken: Yes.   Bimanual Exam:  Uterus:  normal size, contour, position, consistency, mobility, non-tender              Adnexa: normal adnexa and no mass, fullness, tenderness              Rectal exam: Yes.  .  Confirms.              Anus:  normal sphincter tone,  Hemorrhoids and anal prolapse noted.  Chaperone was present for exam.  Assessment:   Well woman visit with normal exam. Osteopenia.  Status post bilateral breast reduction.  Hx CAD.  Hemorrhoids/anal prolapse.   Plan: Yearly mammogram recommended after age 87.  Recommended self breast exam.  Pap and HR HPV as above. Discussed Calcium, Vitamin D, regular exercise  program including cardiovascular and weight bearing exercise. Labs performed.  No..     Prescription medication(s) given.  No..   BMD this year.  This was ordered for [facility]. Discussed referral to colon and rectal surgeon if desires.  Declined. Follow up annually and prn.      After visit summary provided.

## 2016-01-12 NOTE — Patient Instructions (Signed)
Health Maintenance, Female Adopting a healthy lifestyle and getting preventive care can go a long way to promote health and wellness. Talk with your health care provider about what schedule of regular examinations is right for you. This is a good chance for you to check in with your provider about disease prevention and staying healthy. In between checkups, there are plenty of things you can do on your own. Experts have done a lot of research about which lifestyle changes and preventive measures are most likely to keep you healthy. Ask your health care provider for more information. WEIGHT AND DIET  Eat a healthy diet  Be sure to include plenty of vegetables, fruits, low-fat dairy products, and lean protein.  Do not eat a lot of foods high in solid fats, added sugars, or salt.  Get regular exercise. This is one of the most important things you can do for your health.  Most adults should exercise for at least 150 minutes each week. The exercise should increase your heart rate and make you sweat (moderate-intensity exercise).  Most adults should also do strengthening exercises at least twice a week. This is in addition to the moderate-intensity exercise.  Maintain a healthy weight  Body mass index (BMI) is a measurement that can be used to identify possible weight problems. It estimates body fat based on height and weight. Your health care provider can help determine your BMI and help you achieve or maintain a healthy weight.  For females 20 years of age and older:   A BMI below 18.5 is considered underweight.  A BMI of 18.5 to 24.9 is normal.  A BMI of 25 to 29.9 is considered overweight.  A BMI of 30 and above is considered obese.  Watch levels of cholesterol and blood lipids  You should start having your blood tested for lipids and cholesterol at 61 years of age, then have this test every 5 years.  You may need to have your cholesterol levels checked more often if:  Your lipid  or cholesterol levels are high.  You are older than 61 years of age.  You are at high risk for heart disease.  CANCER SCREENING   Lung Cancer  Lung cancer screening is recommended for adults 55-80 years old who are at high risk for lung cancer because of a history of smoking.  A yearly low-dose CT scan of the lungs is recommended for people who:  Currently smoke.  Have quit within the past 15 years.  Have at least a 30-pack-year history of smoking. A pack year is smoking an average of one pack of cigarettes a day for 1 year.  Yearly screening should continue until it has been 15 years since you quit.  Yearly screening should stop if you develop a health problem that would prevent you from having lung cancer treatment.  Breast Cancer  Practice breast self-awareness. This means understanding how your breasts normally appear and feel.  It also means doing regular breast self-exams. Let your health care provider know about any changes, no matter how small.  If you are in your 20s or 30s, you should have a clinical breast exam (CBE) by a health care provider every 1-3 years as part of a regular health exam.  If you are 40 or older, have a CBE every year. Also consider having a breast X-ray (mammogram) every year.  If you have a family history of breast cancer, talk to your health care provider about genetic screening.  If you   are at high risk for breast cancer, talk to your health care provider about having an MRI and a mammogram every year.  Breast cancer gene (BRCA) assessment is recommended for women who have family members with BRCA-related cancers. BRCA-related cancers include:  Breast.  Ovarian.  Tubal.  Peritoneal cancers.  Results of the assessment will determine the need for genetic counseling and BRCA1 and BRCA2 testing. Cervical Cancer Your health care provider may recommend that you be screened regularly for cancer of the pelvic organs (ovaries, uterus, and  vagina). This screening involves a pelvic examination, including checking for microscopic changes to the surface of your cervix (Pap test). You may be encouraged to have this screening done every 3 years, beginning at age 21.  For women ages 30-65, health care providers may recommend pelvic exams and Pap testing every 3 years, or they may recommend the Pap and pelvic exam, combined with testing for human papilloma virus (HPV), every 5 years. Some types of HPV increase your risk of cervical cancer. Testing for HPV may also be done on women of any age with unclear Pap test results.  Other health care providers may not recommend any screening for nonpregnant women who are considered low risk for pelvic cancer and who do not have symptoms. Ask your health care provider if a screening pelvic exam is right for you.  If you have had past treatment for cervical cancer or a condition that could lead to cancer, you need Pap tests and screening for cancer for at least 20 years after your treatment. If Pap tests have been discontinued, your risk factors (such as having a new sexual partner) need to be reassessed to determine if screening should resume. Some women have medical problems that increase the chance of getting cervical cancer. In these cases, your health care provider may recommend more frequent screening and Pap tests. Colorectal Cancer  This type of cancer can be detected and often prevented.  Routine colorectal cancer screening usually begins at 61 years of age and continues through 61 years of age.  Your health care provider may recommend screening at an earlier age if you have risk factors for colon cancer.  Your health care provider may also recommend using home test kits to check for hidden blood in the stool.  A small camera at the end of a tube can be used to examine your colon directly (sigmoidoscopy or colonoscopy). This is done to check for the earliest forms of colorectal  cancer.  Routine screening usually begins at age 50.  Direct examination of the colon should be repeated every 5-10 years through 61 years of age. However, you may need to be screened more often if early forms of precancerous polyps or small growths are found. Skin Cancer  Check your skin from head to toe regularly.  Tell your health care provider about any new moles or changes in moles, especially if there is a change in a mole's shape or color.  Also tell your health care provider if you have a mole that is larger than the size of a pencil eraser.  Always use sunscreen. Apply sunscreen liberally and repeatedly throughout the day.  Protect yourself by wearing long sleeves, pants, a wide-brimmed hat, and sunglasses whenever you are outside. HEART DISEASE, DIABETES, AND HIGH BLOOD PRESSURE   High blood pressure causes heart disease and increases the risk of stroke. High blood pressure is more likely to develop in:  People who have blood pressure in the high end   of the normal range (130-139/85-89 mm Hg).  People who are overweight or obese.  People who are African American.  If you are 38-23 years of age, have your blood pressure checked every 3-5 years. If you are 61 years of age or older, have your blood pressure checked every year. You should have your blood pressure measured twice--once when you are at a hospital or clinic, and once when you are not at a hospital or clinic. Record the average of the two measurements. To check your blood pressure when you are not at a hospital or clinic, you can use:  An automated blood pressure machine at a pharmacy.  A home blood pressure monitor.  If you are between 45 years and 39 years old, ask your health care provider if you should take aspirin to prevent strokes.  Have regular diabetes screenings. This involves taking a blood sample to check your fasting blood sugar level.  If you are at a normal weight and have a low risk for diabetes,  have this test once every three years after 61 years of age.  If you are overweight and have a high risk for diabetes, consider being tested at a younger age or more often. PREVENTING INFECTION  Hepatitis B  If you have a higher risk for hepatitis B, you should be screened for this virus. You are considered at high risk for hepatitis B if:  You were born in a country where hepatitis B is common. Ask your health care provider which countries are considered high risk.  Your parents were born in a high-risk country, and you have not been immunized against hepatitis B (hepatitis B vaccine).  You have HIV or AIDS.  You use needles to inject street drugs.  You live with someone who has hepatitis B.  You have had sex with someone who has hepatitis B.  You get hemodialysis treatment.  You take certain medicines for conditions, including cancer, organ transplantation, and autoimmune conditions. Hepatitis C  Blood testing is recommended for:  Everyone born from [birth year] through 1966.  Anyone with known risk factors for hepatitis C. Sexually transmitted infections (STIs)  You should be screened for sexually transmitted infections (STIs) including gonorrhea and chlamydia if:  You are sexually active and are younger than 61 years of age.  You are older than 61 years of age and your health care provider tells you that you are at risk for this type of infection.  Your sexual activity has changed since you were last screened and you are at an increased risk for chlamydia or gonorrhea. Ask your health care provider if you are at risk.  If you do not have HIV, but are at risk, it may be recommended that you take a prescription medicine daily to prevent HIV infection. This is called pre-exposure prophylaxis (PrEP). You are considered at risk if:  You are sexually active and do not regularly use condoms or know the HIV status of your partner(s).  You take drugs by injection.  You are sexually  active with a partner who has HIV. Talk with your health care provider about whether you are at high risk of being infected with HIV. If you choose to begin PrEP, you should first be tested for HIV. You should then be tested every 3 months for as long as you are taking PrEP.  PREGNANCY   If you are premenopausal and you may become pregnant, ask your health care provider about preconception counseling.  If you may  become pregnant, take 400 to 800 micrograms (mcg) of folic acid every day.  If you want to prevent pregnancy, talk to your health care provider about birth control (contraception). OSTEOPOROSIS AND MENOPAUSE   Osteoporosis is a disease in which the bones lose minerals and strength with aging. This can result in serious bone fractures. Your risk for osteoporosis can be identified using a bone density scan.  If you are 61 years of age or older, or if you are at risk for osteoporosis and fractures, ask your health care provider if you should be screened.  Ask your health care provider whether you should take a calcium or vitamin D supplement to lower your risk for osteoporosis.  Menopause may have certain physical symptoms and risks.  Hormone replacement therapy may reduce some of these symptoms and risks. Talk to your health care provider about whether hormone replacement therapy is right for you.  HOME CARE INSTRUCTIONS   Schedule regular health, dental, and eye exams.  Stay current with your immunizations.   Do not use any tobacco products including cigarettes, chewing tobacco, or electronic cigarettes.  If you are pregnant, do not drink alcohol.  If you are breastfeeding, limit how much and how often you drink alcohol.  Limit alcohol intake to no more than 1 drink per day for nonpregnant women. One drink equals 12 ounces of beer, 5 ounces of wine, or 1 ounces of hard liquor.  Do not use street drugs.  Do not share needles.  Ask your health care provider for help if  you need support or information about quitting drugs.  Tell your health care provider if you often feel depressed.  Tell your health care provider if you have ever been abused or do not feel safe at home.   This information is not intended to replace advice given to you by your health care provider. Make sure you discuss any questions you have with your health care provider.   Document Released: 02/25/2011 Document Revised: 09/02/2014 Document Reviewed: 07/14/2013 Elsevier Interactive Patient Education [employer].

## 2016-01-17 LAB — IPS PAP TEST WITH HPV

## 2016-03-29 ENCOUNTER — Other Ambulatory Visit (HOSPITAL_COMMUNITY): Payer: BC Managed Care – PPO

## 2016-04-01 ENCOUNTER — Telehealth: Payer: Self-pay | Admitting: *Deleted

## 2016-04-01 ENCOUNTER — Ambulatory Visit (HOSPITAL_COMMUNITY)
Admission: RE | Admit: 2016-04-01 | Discharge: 2016-04-01 | Disposition: A | Discharge status: Home / Self Care | Payer: BC Managed Care – PPO | Source: Ambulatory Visit | Attending: Obstetrics and Gynecology | Admitting: Obstetrics and Gynecology

## 2016-04-01 DIAGNOSIS — M85851 Other specified disorders of bone density and structure, right thigh: Secondary | ICD-10-CM | POA: Diagnosis not present

## 2016-04-01 DIAGNOSIS — M8588 Other specified disorders of bone density and structure, other site: Secondary | ICD-10-CM | POA: Insufficient documentation

## 2016-04-01 DIAGNOSIS — M8580 Other specified disorders of bone density and structure, unspecified site: Secondary | ICD-10-CM | POA: Diagnosis present

## 2016-04-01 DIAGNOSIS — Z780 Asymptomatic menopausal state: Secondary | ICD-10-CM | POA: Diagnosis not present

## 2016-04-01 NOTE — Telephone Encounter (Signed)
-----   Message from Nunzio Cobbs, MD sent at 04/01/2016 12:21 PM EDT ----- Please inform patient of her BMD showing osteopenia which is stable at the right hip and spine.  Continue calcium, vit D, and weight bearing exercise.  Follow up BMD in 2 years.

## 2016-04-01 NOTE — Telephone Encounter (Signed)
Call to patient regarding BMD results. Left message to call back. Ask for triage nurse.

## 2016-04-02 NOTE — Telephone Encounter (Signed)
Patient returned call

## 2016-04-02 NOTE — Telephone Encounter (Signed)
Spoke with patient. Advised of results as seen below from Dr.Silva. Patient is agreeable and verbalizes understanding.  Routing to provider for final review. Patient agreeable to disposition. Will close encounter.     

## 2016-05-19 IMAGING — MG MM DIGITAL SCREENING
4 series · 4 of 4 positions shown · non-contrast
Comparison: Previous exam(s)

ACR Breast Density Category a: The breast tissue is almost entirely
fatty.

CLINICAL DATA: Screening.

EXAM:
DIGITAL SCREENING BILATERAL MAMMOGRAM WITH CAD

[L CC]
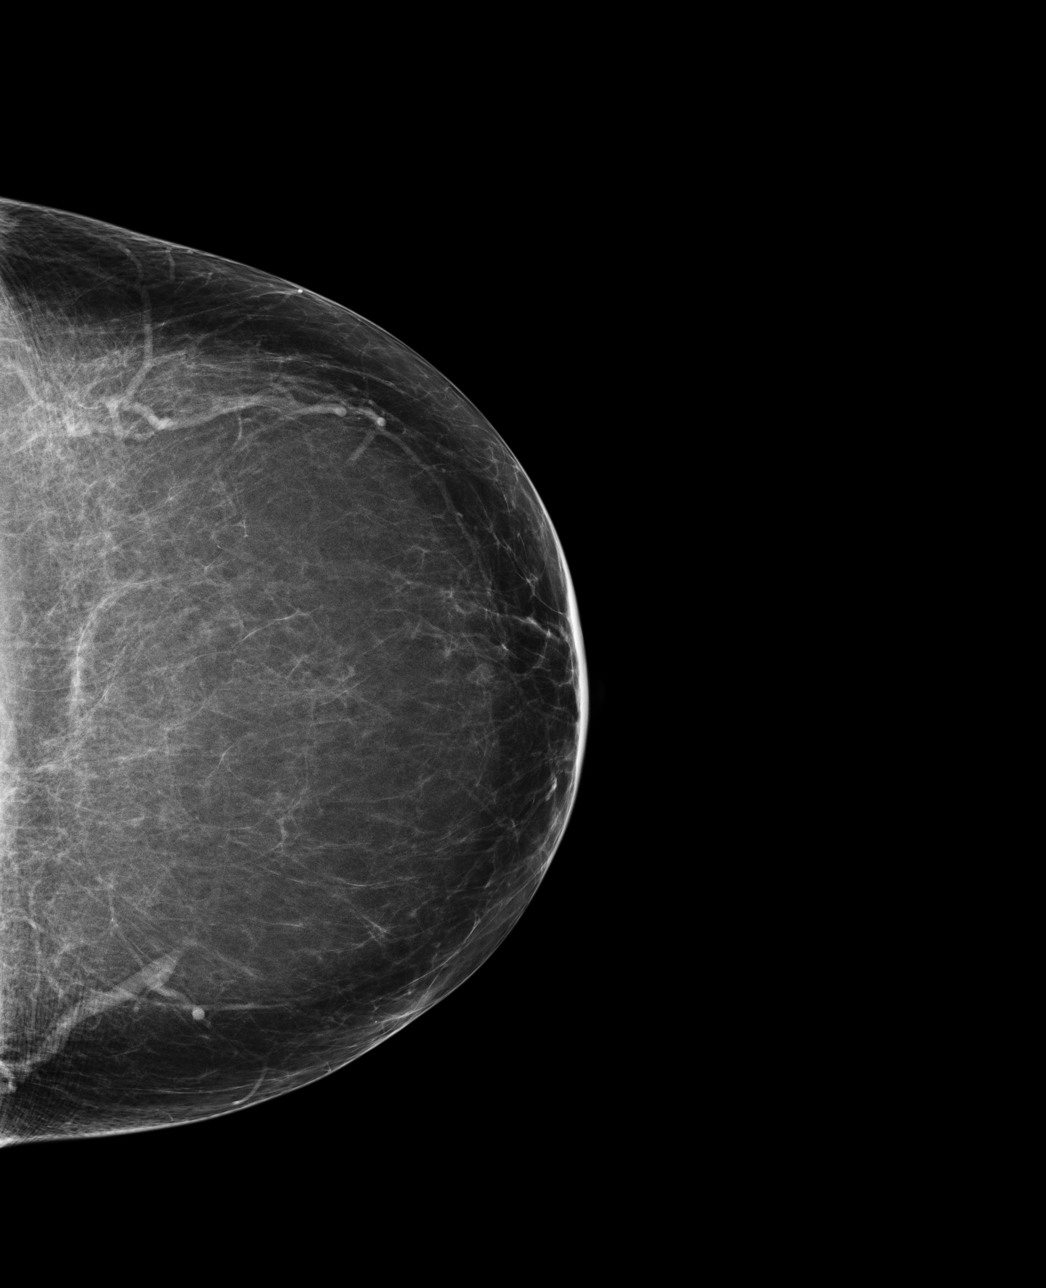

[L MLO]
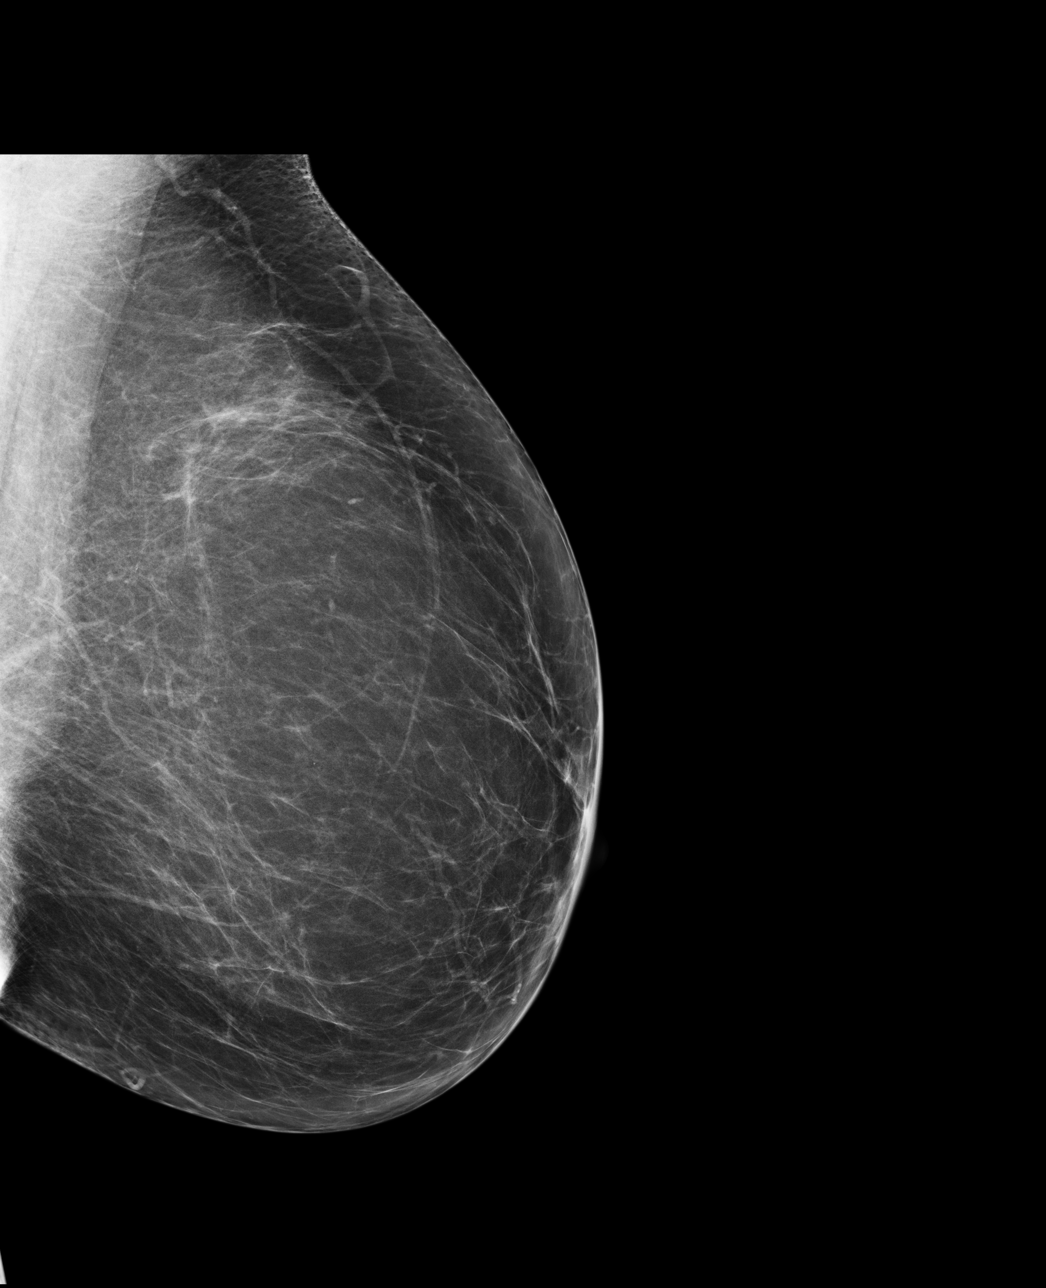

[R CC]
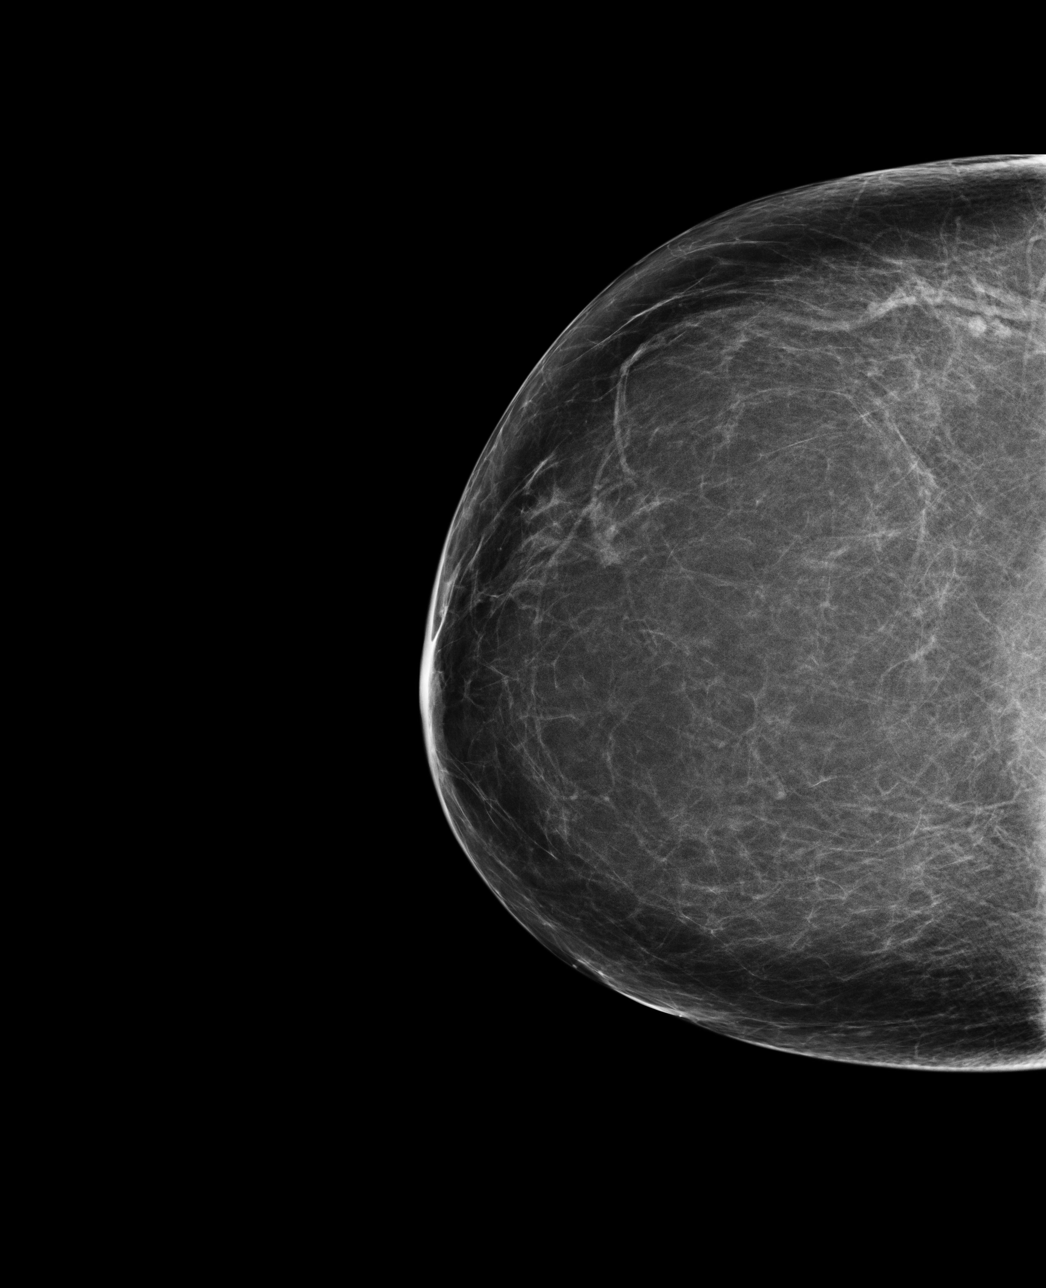

[R MLO]
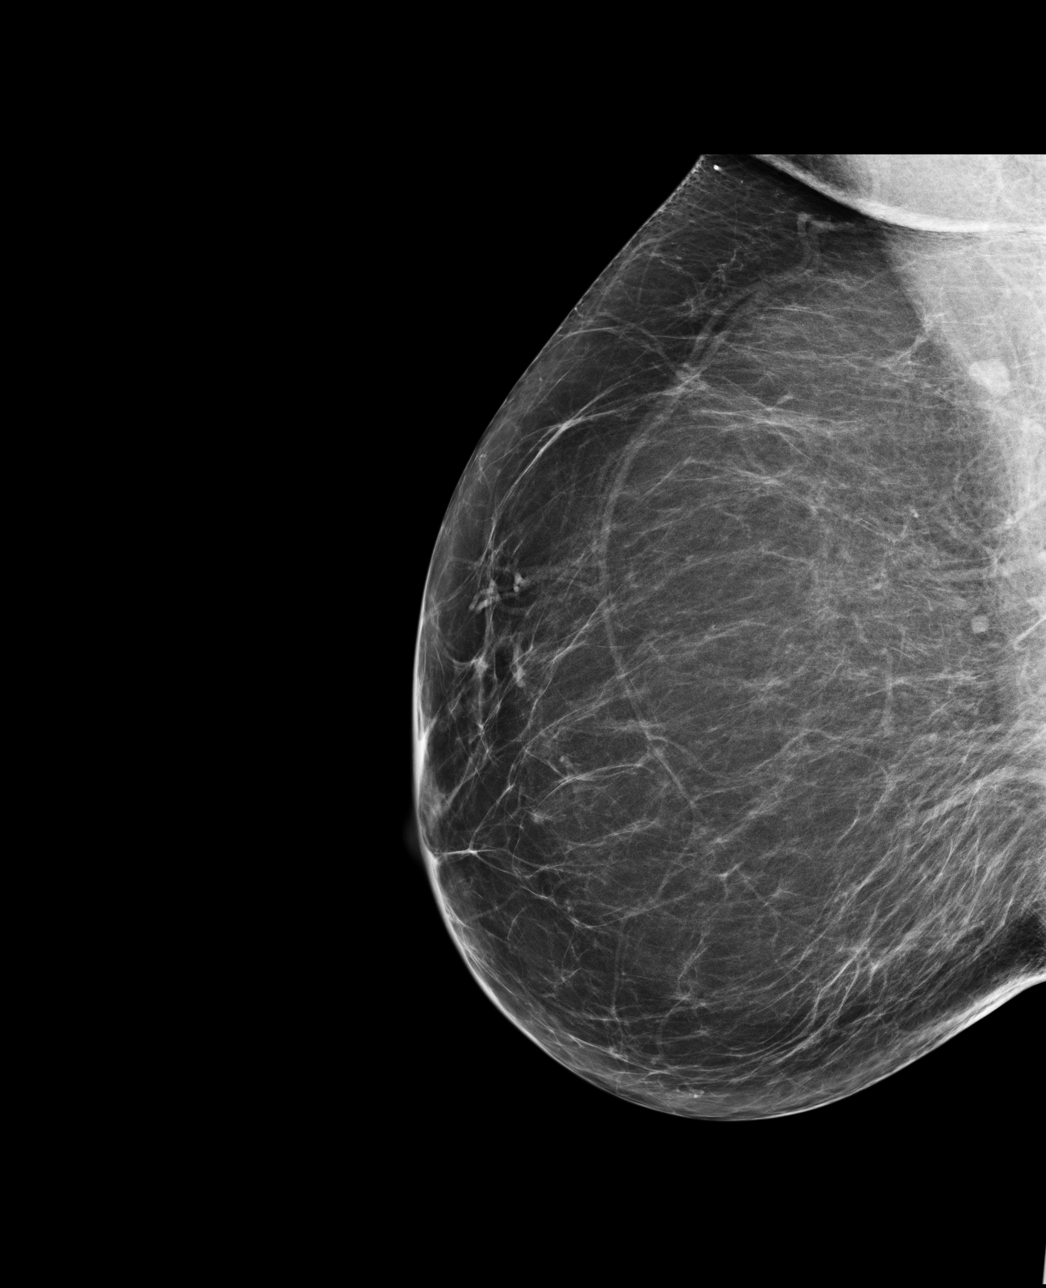

[4 of 4 positions shown; findings below may reference images not displayed]

FINDINGS: There are no findings suspicious for malignancy. Images were
processed with CAD.
IMPRESSION: No mammographic evidence of malignancy. A result letter of this
screening mammogram will be mailed directly to the patient.

RECOMMENDATION:
Screening mammogram in one year. (Code:[id number])

BI-RADS CATEGORY  1: Negative.

## 2016-07-29 ENCOUNTER — Telehealth: Payer: Self-pay | Admitting: Obstetrics and Gynecology

## 2016-07-29 NOTE — Telephone Encounter (Signed)
Patient is asking to talk with Dr.Silva's nurse regarding her Bone Density result. Patient is questioning  the diagnosis code.

## 2016-07-29 NOTE — Telephone Encounter (Signed)
Spoke with patient. Patient has questions about BMD. Advised for BMD performed on 04/01/2016 the diagnosis and diagnosis code were osteopenia. All questions answered regarding concerns. Patient verbalizes understanding.  Routing to provider for final review. Patient agreeable to disposition. Will close encounter.

## 2016-08-07 ENCOUNTER — Other Ambulatory Visit (HOSPITAL_COMMUNITY): Payer: Self-pay | Admitting: Internal Medicine

## 2016-08-07 DIAGNOSIS — N6320 Unspecified lump in the left breast, unspecified quadrant: Secondary | ICD-10-CM

## 2016-10-08 ENCOUNTER — Ambulatory Visit (HOSPITAL_COMMUNITY)
Admission: RE | Admit: 2016-10-08 | Discharge: 2016-10-08 | Disposition: A | Discharge status: Home / Self Care | Payer: BC Managed Care – PPO | Source: Ambulatory Visit | Attending: Internal Medicine | Admitting: Internal Medicine

## 2016-10-08 ENCOUNTER — Encounter (HOSPITAL_COMMUNITY): Payer: BC Managed Care – PPO

## 2016-10-08 DIAGNOSIS — N6320 Unspecified lump in the left breast, unspecified quadrant: Secondary | ICD-10-CM

## 2016-10-08 DIAGNOSIS — N6324 Unspecified lump in the left breast, lower inner quadrant: Secondary | ICD-10-CM | POA: Insufficient documentation

## 2016-11-26 ENCOUNTER — Ambulatory Visit (HOSPITAL_COMMUNITY)
Admission: RE | Admit: 2016-11-26 | Discharge: 2016-11-26 | Disposition: A | Discharge status: Home / Self Care | Payer: BC Managed Care – PPO | Source: Ambulatory Visit | Attending: Registered Nurse | Admitting: Registered Nurse

## 2016-11-26 ENCOUNTER — Other Ambulatory Visit (HOSPITAL_COMMUNITY): Payer: Self-pay | Admitting: Registered Nurse

## 2016-11-26 DIAGNOSIS — M25511 Pain in right shoulder: Secondary | ICD-10-CM

## 2016-11-26 DIAGNOSIS — K219 Gastro-esophageal reflux disease without esophagitis: Secondary | ICD-10-CM | POA: Diagnosis not present

## 2016-11-26 DIAGNOSIS — M19011 Primary osteoarthritis, right shoulder: Secondary | ICD-10-CM | POA: Diagnosis not present

## 2017-01-15 ENCOUNTER — Ambulatory Visit: Payer: BC Managed Care – PPO | Admitting: Obstetrics and Gynecology

## 2017-01-22 ENCOUNTER — Encounter: Payer: Self-pay | Admitting: Obstetrics and Gynecology

## 2017-01-22 ENCOUNTER — Other Ambulatory Visit (HOSPITAL_COMMUNITY)
Admission: RE | Admit: 2017-01-22 | Discharge: 2017-01-22 | Disposition: A | Discharge status: Home / Self Care | Payer: BC Managed Care – PPO | Source: Ambulatory Visit | Attending: Obstetrics and Gynecology | Admitting: Obstetrics and Gynecology

## 2017-01-22 ENCOUNTER — Ambulatory Visit (INDEPENDENT_AMBULATORY_CARE_PROVIDER_SITE_OTHER): Payer: BC Managed Care – PPO | Admitting: Obstetrics and Gynecology

## 2017-01-22 VITALS — BP 100/58 | HR 68 | Resp 16 | Ht 65.0 in | Wt 126.4 lb

## 2017-01-22 DIAGNOSIS — Z01419 Encounter for gynecological examination (general) (routine) without abnormal findings: Secondary | ICD-10-CM

## 2017-01-22 DIAGNOSIS — N6310 Unspecified lump in the right breast, unspecified quadrant: Secondary | ICD-10-CM

## 2017-01-22 DIAGNOSIS — K625 Hemorrhage of anus and rectum: Secondary | ICD-10-CM

## 2017-01-22 DIAGNOSIS — N6320 Unspecified lump in the left breast, unspecified quadrant: Secondary | ICD-10-CM | POA: Diagnosis not present

## 2017-01-22 NOTE — Progress Notes (Signed)
61 y.o. G23P2002 Married Caucasian female here for annual exam.    Vaginal bleeding or spotting.   Very much wants pap done.   Has blood in her stool.   Hx hep A as a teen ager.  PCP:  Dr. Redmond School - [facility] in [place]   Patient's last menstrual period was 08/27/2003.           Sexually active: Yes.    The current method of family planning is tubal ligation.    Exercising: Yes.    P90X 6 days a week Smoker:  Former, quit [place] Maintenance: Pap: 01-12-16 Neg:Neg HR HPV          12-10-13 Neg:Neg HR HPV--Neg 16/18 History of abnormal Pap:  Yes, 1985 colposcopy w/biopsy but no treatment. MMG:  10/07/16 LEFT BREAST -- BIRADS 2 Benign/density a.  [facility]stine Na.  11/27/15 BILATERAL -- BIRADS 1 negative/density b Colonoscopy:  08/2014 1 polyp f/u in 2026.Marland Kitchen Sees Dr. Elesa Massed in [place].  BMD:   04/01/16  Result  Osteopenia. TDaP:  11/19/2012 Gardasil:   N/A HIV: unsure.  Can do with PCP. Hep C: unsure.  Can do with PCP. Screening Labs:  Hb today: labs done with PCP   reports that she quit smoking about 19 years ago. Her smoking use included Cigarettes. She started smoking about 19 years ago. She has a 0.12 pack-year smoking history. She has never used smokeless tobacco. She reports that she drinks about 1.8 oz of alcohol per week . She reports that she does not use drugs.  Past Medical History:  Diagnosis Date  . CAD (coronary artery disease), native coronary artery    50% proximal RCA by heart catheterization 2004 - [facility]  . Cancer (HCC)    Basal cell on nose.  . Depression   . Dyspareunia   . Essential hypertension, benign   . GERD (gastroesophageal reflux disease)   . History of abnormal cervical Pap smear 1990  . History of hepatitis   . Hyperlipidemia   . Osteopenia     Past Surgical History:  Procedure Laterality Date  . APPENDECTOMY  1963  . BASAL CELL CARCINOMA EXCISION  2000   Removed from nose  . BREAST SURGERY  04/2015   breast reduction--David Exie Parody, MD   . COLONOSCOPY N/A 08/03/2014   Procedure: COLONOSCOPY;  Surgeon: Rogene Houston, MD;  [facility];  Service: Endoscopy;  Laterality: N/A;  100  . COLPOSCOPY  1985  . ESOPHAGOGASTRODUODENOSCOPY N/A 08/03/2014   Procedure: ESOPHAGOGASTRODUODENOSCOPY (EGD);  Surgeon: Rogene Houston, MD;  [facility];  Service: Endoscopy;  Laterality: N/A;  . ESOPHAGOGASTRODUODENOSCOPY N/A 09/19/2014   Procedure: ESOPHAGOGASTRODUODENOSCOPY (EGD);  Surgeon: Rogene Houston, MD;  [facility];  Service: Endoscopy;  Laterality: N/A;  730 under fluoro in OR  . SAVORY DILATION N/A 09/19/2014   Procedure: SAVORY DILATION;  Surgeon: Rogene Houston, MD;  [facility];  Service: Endoscopy;  Laterality: N/A;  . TUBAL LIGATION  1990    Current Outpatient Prescriptions  Medication Sig Dispense Refill  . FLUoxetine (PROZAC) 10 MG capsule Take 1 capsule by mouth daily.    . metoprolol tartrate (LOPRESSOR) 25 MG tablet Take 25 mg by mouth 2 (two) times daily.    . Multiple Vitamin (MULTIVITAMIN) capsule Take 1 capsule by mouth daily.    . pravastatin (PRAVACHOL) 20 MG tablet     . valACYclovir (VALTREX) 500 MG tablet Take 1 tablet by mouth as needed.  No current facility-administered medications for this visit.     Family History  Problem Relation Age of Onset  . Hypertension Mother   . Osteoporosis Mother   . Hyperlipidemia Mother   . Hypertension Father   . Heart attack Father   . Hyperlipidemia Father   . Hypertension Sister   . Breast cancer Other   . Hypertension Brother   . Stroke Maternal Grandmother   . Breast cancer Paternal Grandmother     ROS:  Pertinent items are noted in HPI.  Otherwise, a comprehensive ROS was negative.  Exam:   BP (!) 100/58 (BP Location: Right Arm, Patient Position: Sitting, Cuff Size: Normal)   Pulse 68   Resp 16   Ht 5\' 5"  (1.651 m)   Wt 126 lb 6.4 oz (57.3 kg)   LMP 08/27/2003   BMI 21.03 kg/m     General appearance:  alert, cooperative and appears stated age Head: Normocephalic, without obvious abnormality, atraumatic Neck: no adenopathy, supple, symmetrical, trachea midline and thyroid normal to inspection and palpation Lungs: clear to auscultation bilaterally Breasts:  Scars consistent with bilateral reduction.  Right breast with 1 cm mass at 7:00, just outside the areola.  Left breast with 2 masses at 4:00:  3 cm mass and 1 cm mass lateral to this.  Nontender.  No palpable nodes. Heart: regular rate and rhythm Abdomen: soft, non-tender; no masses, no organomegaly Extremities: extremities normal, atraumatic, no cyanosis or edema Skin: Skin color, texture, turgor normal. No rashes or lesions Lymph nodes: Cervical, supraclavicular, and axillary nodes normal. No abnormal inguinal nodes palpated Neurologic: Grossly normal  Pelvic: External genitalia:  no lesions              Urethra:  normal appearing urethra with no masses, tenderness or lesions              Bartholins and Skenes: normal                 Vagina: normal appearing vagina with normal color and discharge, no lesions              Cervix: no lesions              Pap taken: Yes.   Bimanual Exam:  Uterus:  normal size, contour, position, consistency, mobility, non-tender              Adnexa: no mass, fullness, tenderness              Rectal exam: Yes.  .  Confirms.              Anus:  normal sphincter tone,  Rectal prolapse noted.  Chaperone was present for exam.  Assessment:   Well woman visit with normal exam. Osteopenia.   Status post bilateral breast reduction.  Bilateral breast masses.  Hx CAD.  Hemorrhoids/anal prolapse.  Rectal bleeding.   Plan: Mammogram screening discussed. Recommended self breast awareness. Pap and HR HPV as above. Guidelines for Calcium, Vitamin D, regular exercise program including cardiovascular and weight bearing exercise. I am recommending bilateral diagnostic mammogram and bilateral ultrasound  instead of her routine screening mammogram. Patient is resistant to the imaging as she is concerned her insurance will not pay for this.  She also states that her surgeon has known about these lumps for a while following her reduction surgery.  I do not see any documentation of this on review of his office notes and her recent dx left breast mammogram result does  not correlate with the findings on exam today.  I have stated that we will refer her back to her breast surgeon for further care.  I am not comfortable telling her she does not need diagnostic evaluation.  Needs to see GI.  We will refer her back to Dr. Elesa Massed in [place].  BMD 2019. Follow up annually and prn.    After visit summary provided.

## 2017-01-22 NOTE — Patient Instructions (Signed)

## 2017-01-23 ENCOUNTER — Other Ambulatory Visit (HOSPITAL_COMMUNITY): Payer: Self-pay | Admitting: Internal Medicine

## 2017-01-23 ENCOUNTER — Telehealth: Payer: Self-pay | Admitting: Obstetrics and Gynecology

## 2017-01-23 DIAGNOSIS — Z1231 Encounter for screening mammogram for malignant neoplasm of breast: Secondary | ICD-10-CM

## 2017-01-23 DIAGNOSIS — K625 Hemorrhage of anus and rectum: Secondary | ICD-10-CM

## 2017-01-23 DIAGNOSIS — K649 Unspecified hemorrhoids: Secondary | ICD-10-CM

## 2017-01-23 DIAGNOSIS — K623 Rectal prolapse: Secondary | ICD-10-CM

## 2017-01-23 NOTE — Telephone Encounter (Signed)
Patient also needs to see Dr. Crissie Reese her plastic surgeon about bilateral breast masses.  I recommended she have bilateral diagnostic mammograms and ultrasounds instead of a screening mammogram.  She declines this. I told patient that we can initiate a conversation with her plastic surgeon's office to see if he has a different opinion.  I suspect he will want her to have an appointment with him.

## 2017-01-23 NOTE — Telephone Encounter (Addendum)
Please assist in making 2 referrals for the patient.  She needs to see Dr. Mayer Camel, gastroenterology, in [place] for rectal bleeding, rectal prolapse, and hemorrhoids.

## 2017-01-24 LAB — CYTOLOGY - PAP
DIAGNOSIS: NEGATIVE
HPV: NOT DETECTED

## 2017-01-24 NOTE — Telephone Encounter (Signed)
Melinda Garza is working on referral to Dr.Bowers at this time. Referral to Dr.Rehman entered for patient to be seen for rectal bleeding, rectal prolapse, and hemorrhoids. Routing to Melinda Garza for referral coordination.

## 2017-01-24 NOTE — Telephone Encounter (Signed)
Patient is already established with Dr. Harlow Mares, so she does not need a referral.  Please reach out to his nurse to see if he would like to see her for an appointment.  I am not comfortable having her not do the diagnostic mammogram and ultrasound evaluation.  The patient states the areas that I feel are already known to her breast surgeon.  I cannot find documentation that he has palpated these areas.  [place]

## 2017-01-27 ENCOUNTER — Telehealth: Payer: Self-pay

## 2017-01-27 NOTE — Telephone Encounter (Signed)
-----   Message from Nunzio Cobbs, MD sent at 01/25/2017 11:09 AM EDT ----- Please report normal pap and negative HR HPV.  Recall - 02.   Please note that I also sent a message through to contact her plastic surgeon's office about a potential office visit for her bilateral breast lumps for which she did not want me to order a dx mammogam and ultrasound.

## 2017-01-27 NOTE — Telephone Encounter (Signed)
Call to Dr.Bower's office. Dr.Bowers will review the patient's records and our office will be contacted via fax or telephone call regarding his recommendations.

## 2017-01-27 NOTE — Telephone Encounter (Signed)
Call to Dr. Alvira Philips office to check status of appointment. Their scheduler initially recommended scheduling in July. Reasons noted from referral and office note. They agreed with recommendation for appointment and moved her appointment to 01-29-17.   Spoke with patient and relayed all information. She is agreeable to appointment information.  Routing to Dr. Quincy Simmonds for review prior to closing encounter.  Cc: Verline Lema

## 2017-01-27 NOTE — Telephone Encounter (Signed)
Spoke with patient. Advised of message as seen below from [place]. Patient verbalizes understanding 02 recall placed. Please see telephone encounter dated 01/23/2017 regarding call to Dr.Bower's office. Patient is scheduled for an appointment on 01/29/2017 to see Dr.Bowers.  Routing to provider for final review. Patient agreeable to disposition. Will close encounter.

## 2017-01-31 ENCOUNTER — Ambulatory Visit (INDEPENDENT_AMBULATORY_CARE_PROVIDER_SITE_OTHER): Payer: BC Managed Care – PPO | Admitting: Internal Medicine

## 2017-02-03 ENCOUNTER — Encounter (INDEPENDENT_AMBULATORY_CARE_PROVIDER_SITE_OTHER): Payer: Self-pay

## 2017-02-03 ENCOUNTER — Encounter (INDEPENDENT_AMBULATORY_CARE_PROVIDER_SITE_OTHER): Payer: Self-pay | Admitting: Internal Medicine

## 2017-02-03 ENCOUNTER — Ambulatory Visit (INDEPENDENT_AMBULATORY_CARE_PROVIDER_SITE_OTHER): Payer: BC Managed Care – PPO | Admitting: Internal Medicine

## 2017-02-03 VITALS — BP 110/72 | HR 60 | Temp 98.0°F | Ht 65.5 in | Wt 126.9 lb

## 2017-02-03 DIAGNOSIS — K625 Hemorrhage of anus and rectum: Secondary | ICD-10-CM

## 2017-02-03 DIAGNOSIS — K644 Residual hemorrhoidal skin tags: Secondary | ICD-10-CM

## 2017-02-03 NOTE — Patient Instructions (Addendum)
  3 stool cards sent home with patient  OV as needed.

## 2017-02-03 NOTE — Progress Notes (Addendum)
Subjective:    Patient ID: Melinda Garza, female    DOB: [birth year], 61 y.o.   MRN: [id number]    Presents today with c/o rectal bleeding. Notices when she wipes. She sees blood in the toilet tissue. Symptoms off and on x 2  Years.   Saw a small amt of blood yesterday.  No weight loss. Appetite is good. Has a BM x 1. No rectal pain.   01/24/2017 H and H 14.0 and 40.5, MCV 89   EGD  EGD/ed 2016 Dysphagia  Impression: Somewhat tortuous segment of proximal esophagus. Small sliding hiatal hernia without evidence of ring or stricture formation. Esophagus initially dilated by passing 15 and 16 mm Savary dilators over guidewire under fluoroscopic control. Esophagus subsequently  dilated by passing 24 [place] or 18 mm Maloney dilator under fluoroscopic control resulting in linear mucosal disruption indicated of a web.  Colonoscopy/ED 2015  Impression:  EGD findings; Tortuous upper esophageal sphincter. No evidence of erosive esophagitis ring or stricture formation. Small sliding hiatal hernia. No evidence of gastritis or peptic ulcer disease.  Colonoscopy findings; Examination performed to cecum. 4 mm polyp cold snare from distal sigmoid colon. Prominent external hemorrhoids and anal papillae.  Recommendations:  Patient advised to keep NSAID use to minimum. Barium pill esophagogram to be scheduled. Benefiber 4 g by mouth daily at bedtime. Anusol HC suppository 1 per rectum daily at bedtime for 2 weeks. I will be contacting patient with biopsy results.   Review of Systems Past Medical History:  Diagnosis Date  . CAD (coronary artery disease), native coronary artery    50% proximal RCA by heart catheterization 2004 - [facility]  . Cancer (HCC)    Basal cell on nose.  . Depression   . Dyspareunia   . Essential hypertension, benign   . GERD (gastroesophageal reflux disease)   . History of abnormal cervical Pap smear 1990  . History of hepatitis   . Hyperlipidemia   .  Osteopenia     Past Surgical History:  Procedure Laterality Date  . APPENDECTOMY  1963  . BASAL CELL CARCINOMA EXCISION  2000   Removed from nose  . BREAST SURGERY  04/2015   breast reduction--David Exie Parody, MD  . COLONOSCOPY N/A 08/03/2014   Procedure: COLONOSCOPY;  Surgeon: Rogene Houston, MD;  [facility];  Service: Endoscopy;  Laterality: N/A;  100  . COLPOSCOPY  1985  . ESOPHAGOGASTRODUODENOSCOPY N/A 08/03/2014   Procedure: ESOPHAGOGASTRODUODENOSCOPY (EGD);  Surgeon: Rogene Houston, MD;  [facility];  Service: Endoscopy;  Laterality: N/A;  . ESOPHAGOGASTRODUODENOSCOPY N/A 09/19/2014   Procedure: ESOPHAGOGASTRODUODENOSCOPY (EGD);  Surgeon: Rogene Houston, MD;  [facility];  Service: Endoscopy;  Laterality: N/A;  730 under fluoro in OR  . SAVORY DILATION N/A 09/19/2014   Procedure: SAVORY DILATION;  Surgeon: Rogene Houston, MD;  [facility];  Service: Endoscopy;  Laterality: N/A;  . TUBAL LIGATION  1990    Allergies  Allergen Reactions  . Sulfonamide Derivatives Hives    Current Outpatient Prescriptions on File Prior to Visit  Medication Sig Dispense Refill  . FLUoxetine (PROZAC) 10 MG capsule Take 1 capsule by mouth daily.    . metoprolol tartrate (LOPRESSOR) 25 MG tablet Take 25 mg by mouth 2 (two) times daily.    . Multiple Vitamin (MULTIVITAMIN) capsule Take 1 capsule by mouth daily.    . pravastatin (PRAVACHOL) 20 MG tablet     . valACYclovir (VALTREX) 500 MG tablet  Take 1 tablet by mouth as needed.     No current facility-administered medications on file prior to visit.         Objective:   Physical Exam Blood pressure 110/72, pulse 60, temperature 98 F (36.7 C), height 5' 5.5" (1.664 m), weight 126 lb 14.4 oz (57.6 kg), last menstrual period 08/27/2003.  Alert and oriented. Skin warm and dry. Oral mucosa is moist.   . Sclera anicteric, conjunctivae is pink. Thyroid not enlarged. No cervical lymphadenopathy. Lungs  clear. Heart regular rate and rhythm.  Abdomen is soft. Bowel sounds are positive. No hepatomegaly. No abdominal masses felt. No tenderness.  No edema to lower extremities.   Stool brown and guaiac negative. Prominent external hemorrhoids.        Assessment & Plan:  Rectal bleeding. Probably hemorrhoids.  3 stools cards home with patient. Labs from Dr. [name].

## 2017-02-05 ENCOUNTER — Ambulatory Visit (HOSPITAL_COMMUNITY)
Admission: RE | Admit: 2017-02-05 | Discharge: 2017-02-05 | Disposition: A | Discharge status: Home / Self Care | Payer: BC Managed Care – PPO | Source: Ambulatory Visit | Attending: Internal Medicine | Admitting: Internal Medicine

## 2017-02-05 DIAGNOSIS — Z1231 Encounter for screening mammogram for malignant neoplasm of breast: Secondary | ICD-10-CM | POA: Diagnosis present

## 2017-02-06 ENCOUNTER — Telehealth (INDEPENDENT_AMBULATORY_CARE_PROVIDER_SITE_OTHER): Payer: Self-pay | Admitting: *Deleted

## 2017-02-06 NOTE — Telephone Encounter (Signed)
   Diagnosis:    Result(s)   Card 1:  Negative:     Card 2:  Negative:   Card 3:Negative:    Completed by: Ambrie Carte,LPN   HEMOCCULT SENSA DEVELOPER: LOT#:  [id number] EXPIRATION DATE: 2020/05   HEMOCCULT SENSA CARD:  LOT#:  [id number] EXPIRATION DATE: 05/20   CARD CONTROL RESULTS:  POSITIVE: Positive NEGATIVE: Negative    ADDITIONAL COMMENTS: Patient was called with her results.

## 2017-02-06 NOTE — Telephone Encounter (Signed)
Results given to patient

## 2018-01-12 ENCOUNTER — Other Ambulatory Visit (HOSPITAL_COMMUNITY): Payer: Self-pay | Admitting: Internal Medicine

## 2018-01-12 DIAGNOSIS — Z1231 Encounter for screening mammogram for malignant neoplasm of breast: Secondary | ICD-10-CM

## 2018-01-26 ENCOUNTER — Ambulatory Visit: Payer: Self-pay | Admitting: Obstetrics and Gynecology

## 2018-02-18 ENCOUNTER — Ambulatory Visit (HOSPITAL_COMMUNITY)
Admission: RE | Admit: 2018-02-18 | Discharge: 2018-02-18 | Disposition: A | Discharge status: Home / Self Care | Payer: BC Managed Care – PPO | Source: Ambulatory Visit | Attending: Internal Medicine | Admitting: Internal Medicine

## 2018-02-18 DIAGNOSIS — Z1231 Encounter for screening mammogram for malignant neoplasm of breast: Secondary | ICD-10-CM | POA: Diagnosis present

## 2018-02-19 NOTE — Progress Notes (Signed)
62 y.o. G49P2002 Married Caucasian female here for annual exam.    No vaginal bleeding.   Really wants a pap today.  Saw her GI last year and did not receive any new medication for rectal bleeding.   Saw her breast surgeon last year after her visit here and he was comfortable with her breast exam and masses which were attributed to scar tissue.   Labs with PCP.  Increased her cholesterol medication.   PCP:   Redmond School, MD  Patient's last menstrual period was 08/27/2003.           Sexually active: Yes.    The current method of family planning is post menopausal status.    Exercising: Yes.    P90X program Smoker:  Former smoker   Health Maintenance: Pap:  01-22-17 negative, HR HPV negative, 01-12-16 negative, HR HPV negative  History of abnormal Pap:  Yes.  She describes treatment in a hospital setting.  MMG:  02-18-18 density a/ BIRADS 1 negative  Colonoscopy:  08/2014 1 polyp- f/u 2026. Sees Dr. Elesa Massed in [place]  BMD:   04-01-16  Result  Osteopenia.  FRAX model - low risk for fracture.  TDaP:  11-19-12  Screening Labs: Blood work with PCP   reports that she has been smoking cigarettes.  She started smoking about 20 years ago. She has never used smokeless tobacco. She reports that she drinks about 1.8 oz of alcohol per week. She reports that she does not use drugs.  No currently smoking or vaping.  This was a very temporary 1 pack total during her divorce.   Past Medical History:  Diagnosis Date  . CAD (coronary artery disease), native coronary artery    50% proximal RCA by heart catheterization 2004 - [facility]  . Cancer (HCC)    Basal cell on nose.  . Depression   . Dyspareunia   . Essential hypertension, benign   . GERD (gastroesophageal reflux disease)   . History of abnormal cervical Pap smear 1990  . History of hepatitis   . Hyperlipidemia   . Osteopenia     Past Surgical History:  Procedure Laterality Date  . APPENDECTOMY  1963  . BASAL CELL CARCINOMA EXCISION   2000   Removed from nose  . BREAST SURGERY  04/2015   breast reduction--David Exie Parody, MD  . COLONOSCOPY N/A 08/03/2014   Procedure: COLONOSCOPY;  Surgeon: Rogene Houston, MD;  [facility];  Service: Endoscopy;  Laterality: N/A;  100  . COLPOSCOPY  1985  . ESOPHAGOGASTRODUODENOSCOPY N/A 08/03/2014   Procedure: ESOPHAGOGASTRODUODENOSCOPY (EGD);  Surgeon: Rogene Houston, MD;  [facility];  Service: Endoscopy;  Laterality: N/A;  . ESOPHAGOGASTRODUODENOSCOPY N/A 09/19/2014   Procedure: ESOPHAGOGASTRODUODENOSCOPY (EGD);  Surgeon: Rogene Houston, MD;  [facility];  Service: Endoscopy;  Laterality: N/A;  730 under fluoro in OR  . SAVORY DILATION N/A 09/19/2014   Procedure: SAVORY DILATION;  Surgeon: Rogene Houston, MD;  [facility];  Service: Endoscopy;  Laterality: N/A;  . TUBAL LIGATION  1990    Current Outpatient Medications  Medication Sig Dispense Refill  . aspirin 81 MG chewable tablet Chew by mouth daily.    Marland Kitchen FLUoxetine (PROZAC) 10 MG capsule Take 1 capsule by mouth daily.    . metoprolol tartrate (LOPRESSOR) 25 MG tablet Take 25 mg by mouth 2 (two) times daily.    . Multiple Vitamin (MULTIVITAMIN) capsule Take 1 capsule by mouth daily.    Marland Kitchen  pravastatin (PRAVACHOL) 20 MG tablet     . valACYclovir (VALTREX) 500 MG tablet Take 1 tablet by mouth as needed.     No current facility-administered medications for this visit.     Family History  Problem Relation Age of Onset  . Hypertension Mother   . Osteoporosis Mother   . Hyperlipidemia Mother   . Hypertension Father   . Heart attack Father   . Hyperlipidemia Father   . Hypertension Sister   . Breast cancer Other   . Hypertension Brother   . Stroke Maternal Grandmother   . Breast cancer Paternal Grandmother     Review of Systems  Constitutional: Negative.   HENT: Negative.   Eyes: Negative.   Respiratory: Negative.   Cardiovascular: Negative.   Gastrointestinal: Negative.    Endocrine: Negative.   Genitourinary: Negative.   Musculoskeletal: Negative.   Skin: Negative.   Allergic/Immunologic: Negative.   Neurological: Negative.   Hematological: Negative.   Psychiatric/Behavioral: Negative.     Exam:   BP 110/70   Pulse 60   Ht 5' 5.75" (1.67 m)   Wt 135 lb 6.4 oz (61.4 kg)   LMP 08/27/2003   BMI 22.02 kg/m     General appearance: alert, cooperative and appears stated age Head: Normocephalic, without obvious abnormality, atraumatic Neck: no adenopathy, supple, symmetrical, trachea midline and thyroid normal to inspection and palpation Lungs: clear to auscultation bilaterally Breasts:  Scars consistent with prior breast surgery.  Right breast with 1 cm mass at 8:00 near nipple.   Left breast with 3 cm lung at 4:00 near nipple.  Two 8 mm lumps at 3:00 and 4:00 lateral to nipple.  No axillary adenopathy bilaterally.  Heart: regular rate and rhythm Abdomen: soft, non-tender; no masses, no organomegaly Extremities: extremities normal, atraumatic, no cyanosis or edema Skin: Skin color, texture, turgor normal. No rashes or lesions Lymph nodes: Cervical, supraclavicular, and axillary nodes normal. No abnormal inguinal nodes palpated Neurologic: Grossly normal  Pelvic: External genitalia:  no lesions              Urethra:  normal appearing urethra with no masses, tenderness or lesions              Bartholins and Skenes: normal                 Vagina: normal appearing vagina with normal color and discharge, no lesions              Cervix: no lesions              Pap taken: Yes.   Bimanual Exam:  Uterus:  normal size, contour, position, consistency, mobility, non-tender              Adnexa: no mass, fullness, tenderness              Rectal exam: Yes.  .  Confirms.              Anus:  normal sphincter tone, mild rectal prolapse.  Chaperone was present for exam.  Assessment:   Well woman visit with normal exam. Osteopenia.   Hx abnormal pap in remote  past with treatment.  Status post bilateral breast reduction.  Bilateral breast masses.  Hx CAD.  Hemorrhoids/anal prolapse.   Plan: Mammogram screening.  I recommended dx imaging, and she declines this.  I recommend she have her breast surgeon follow her breast exams and care.  She is in agreement with this.  Recommended self breast  awareness. Pap and HR HPV as above. Guidelines for Calcium, Vitamin D, regular exercise program including cardiovascular and weight bearing exercise. Prefers to wait to do BMD next year.  Follow up annually and prn.    After visit summary provided.

## 2018-02-20 ENCOUNTER — Other Ambulatory Visit: Payer: Self-pay

## 2018-02-20 ENCOUNTER — Ambulatory Visit (INDEPENDENT_AMBULATORY_CARE_PROVIDER_SITE_OTHER): Payer: BC Managed Care – PPO | Admitting: Obstetrics and Gynecology

## 2018-02-20 ENCOUNTER — Other Ambulatory Visit (HOSPITAL_COMMUNITY)
Admission: RE | Admit: 2018-02-20 | Discharge: 2018-02-20 | Disposition: A | Discharge status: Home / Self Care | Payer: BC Managed Care – PPO | Source: Ambulatory Visit | Attending: Obstetrics and Gynecology | Admitting: Obstetrics and Gynecology

## 2018-02-20 ENCOUNTER — Encounter: Payer: Self-pay | Admitting: Obstetrics and Gynecology

## 2018-02-20 VITALS — BP 110/70 | HR 60 | Ht 65.75 in | Wt 135.4 lb

## 2018-02-20 DIAGNOSIS — Z01419 Encounter for gynecological examination (general) (routine) without abnormal findings: Secondary | ICD-10-CM | POA: Diagnosis present

## 2018-02-20 DIAGNOSIS — N6320 Unspecified lump in the left breast, unspecified quadrant: Secondary | ICD-10-CM | POA: Diagnosis not present

## 2018-02-20 DIAGNOSIS — N6310 Unspecified lump in the right breast, unspecified quadrant: Secondary | ICD-10-CM | POA: Diagnosis not present

## 2018-02-20 NOTE — Patient Instructions (Signed)

## 2018-02-23 ENCOUNTER — Telehealth: Payer: Self-pay | Admitting: Obstetrics and Gynecology

## 2018-02-23 LAB — CYTOLOGY - PAP
DIAGNOSIS: NEGATIVE
HPV (WINDOPATH): NOT DETECTED

## 2018-02-23 NOTE — Telephone Encounter (Signed)
Please contact patient in follow up her her recent office visit for her annual exam.   She has persistent breast lumps following her prior breast reduction surgery.   Last year I sent the patient to her breast surgeon for evaluation as I found bilateral breast masses, and the patient declined diagnostic testing.  I feel the lumps again this year and a potential new lump of the left breast.   The patient and I talked about her clinical picture, and I recommend she now see her breast surgeon for regular follow up of her breast care and ordering of her screening and diagnostic testing.  I am not comfortable following her for the masses without following usual protocols for diagnostic testing.  She is having screening mammograms and not diagnostic mammograms. She is in agreement with this new plan.  I am recommending we schedule an appointment for her to see her breast surgeon and make sure that he understands he will now be following her breast care.   She will continue her GYN care here.

## 2018-02-24 NOTE — Telephone Encounter (Signed)
Spoke with patient , advised as seen below per Dr. Quincy Simmonds. Patient declines to scheudule  diagnostic MMG, stating she just had screening MMG 02/18/18, will plan to further discuss recommendations with Dr. Harlow Mares. Difference of screening and Dx MMG explained, patient states Dr. Quincy Simmonds explained this already while in office. Patient states she will not be available for appointments for 3-4wks.  Advised will update Dr. Quincy Simmonds and our office will return call with any additional recommendations. Patient verbalizes understanding.   Routing to Dr. Quincy Simmonds and Kandace Blitz, RN

## 2018-02-24 NOTE — Telephone Encounter (Signed)
Call returned to patient. OK per dpr, left detailed message advising of appointment details as seen below with Dr. Harlow Mares. Return call to office if any additional questions or concerns.   Routing to provider for final review. Patient is agreeable to disposition. Will close encounter.

## 2018-02-24 NOTE — Telephone Encounter (Signed)
Spoke with Melinda Garza, request to schedule f/u with Dr. Harlow Mares for evaluation of breast lumps and new lump of left breast, advised as seen below per Dr. Quincy Simmonds. Melinda Garza advised she will review with office manager and return call prior to scheduling. Advised to return call to [place], Therapist, sports at [facility].

## 2018-02-24 NOTE — Telephone Encounter (Signed)
Barnett Applebaum returning call. States Dr. Harlow Mares will see patient for evaluation, does not order diagnostic imaging, would be referred back to PCP if diagnostic breast imaging is recommended.   OV scheduled with Dr. Harlow Mares on 04/01/18 at 11:15am.

## 2018-02-24 NOTE — Telephone Encounter (Signed)
I recommend diagnostic testing PRIOR to her visit with him.  I recommend bilateral diagnostic mammogram and ultrasounds.   Cc - Lamont Snowball

## 2018-02-24 NOTE — Telephone Encounter (Signed)
Spoke with patient. Patient will be out of town for the next 3-4 wks, request to schedule appointment after. Advised I am awaiting return call from Dr. Harlow Mares office, will notify of appt once scheduled. Patient verbalizes understanding.

## 2018-02-27 NOTE — Telephone Encounter (Signed)
Please place patient in mammogram recall for August 2019.   Cc- Melinda Garza

## 2018-02-27 NOTE — Telephone Encounter (Signed)
MMG recall entered for 04-24-18.

## 2018-02-27 NOTE — Telephone Encounter (Signed)
Recall for August 2019 entered.

## 2018-08-11 ENCOUNTER — Other Ambulatory Visit (HOSPITAL_COMMUNITY): Payer: Self-pay | Admitting: Plastic Surgery

## 2018-08-11 DIAGNOSIS — N63 Unspecified lump in unspecified breast: Secondary | ICD-10-CM

## 2018-08-18 ENCOUNTER — Ambulatory Visit (HOSPITAL_COMMUNITY): Payer: BC Managed Care – PPO

## 2018-08-18 ENCOUNTER — Ambulatory Visit (HOSPITAL_COMMUNITY)
Admission: RE | Admit: 2018-08-18 | Discharge: 2018-08-18 | Disposition: A | Discharge status: Home / Self Care | Payer: BC Managed Care – PPO | Source: Ambulatory Visit | Attending: Plastic Surgery | Admitting: Plastic Surgery

## 2018-08-18 DIAGNOSIS — N63 Unspecified lump in unspecified breast: Secondary | ICD-10-CM | POA: Insufficient documentation

## 2019-01-21 ENCOUNTER — Other Ambulatory Visit: Payer: Self-pay | Admitting: Physician Assistant

## 2019-02-17 ENCOUNTER — Other Ambulatory Visit (HOSPITAL_COMMUNITY): Payer: Self-pay | Admitting: Family Medicine

## 2019-02-17 DIAGNOSIS — Z1231 Encounter for screening mammogram for malignant neoplasm of breast: Secondary | ICD-10-CM

## 2019-02-22 ENCOUNTER — Encounter (HOSPITAL_COMMUNITY): Payer: Self-pay

## 2019-02-22 ENCOUNTER — Ambulatory Visit (HOSPITAL_COMMUNITY)
Admission: RE | Admit: 2019-02-22 | Discharge: 2019-02-22 | Disposition: A | Discharge status: Home / Self Care | Payer: BC Managed Care – PPO | Source: Ambulatory Visit | Attending: Family Medicine | Admitting: Family Medicine

## 2019-02-22 ENCOUNTER — Other Ambulatory Visit: Payer: Self-pay

## 2019-02-22 DIAGNOSIS — Z1231 Encounter for screening mammogram for malignant neoplasm of breast: Secondary | ICD-10-CM | POA: Insufficient documentation

## 2019-03-15 ENCOUNTER — Other Ambulatory Visit: Payer: Self-pay

## 2019-03-17 ENCOUNTER — Encounter: Payer: Self-pay | Admitting: Obstetrics and Gynecology

## 2019-03-17 ENCOUNTER — Ambulatory Visit: Payer: BC Managed Care – PPO | Admitting: Obstetrics and Gynecology

## 2019-03-17 ENCOUNTER — Other Ambulatory Visit: Payer: Self-pay

## 2019-03-17 ENCOUNTER — Other Ambulatory Visit (HOSPITAL_COMMUNITY)
Admission: RE | Admit: 2019-03-17 | Discharge: 2019-03-17 | Disposition: A | Discharge status: Home / Self Care | Payer: BC Managed Care – PPO | Source: Ambulatory Visit | Attending: Obstetrics and Gynecology | Admitting: Obstetrics and Gynecology

## 2019-03-17 VITALS — BP 122/80 | HR 72 | Temp 97.2°F | Resp 12 | Ht 65.5 in | Wt 143.0 lb

## 2019-03-17 DIAGNOSIS — Z01419 Encounter for gynecological examination (general) (routine) without abnormal findings: Secondary | ICD-10-CM

## 2019-03-17 DIAGNOSIS — N6320 Unspecified lump in the left breast, unspecified quadrant: Secondary | ICD-10-CM

## 2019-03-17 DIAGNOSIS — Z780 Asymptomatic menopausal state: Secondary | ICD-10-CM

## 2019-03-17 DIAGNOSIS — N6310 Unspecified lump in the right breast, unspecified quadrant: Secondary | ICD-10-CM

## 2019-03-17 NOTE — Progress Notes (Signed)
63 y.o. G66P2002 Married Caucasian female here for annual exam.    No vaginal bleeding.  No bladder or bowel function issues.   Hx breast lumps followed by her plastic surgeon, Dr. Crissie Reese.   Right breast with 1 cm mass at 8:00 near nipple.   Left breast with 3 cm lump at 4:00 near nipple.  Two 8 mm lumps at 3:00 and 4:00 lateral to nipple.  Working in her garden.  Grandchildren visiting from [place].  PCP: Redmond School, MD    Patient's last menstrual period was 08/27/2003.           Sexually active: Yes.    The current method of family planning is post menopausal status.    Exercising: Yes.    bike riding Smoker:  Former smoker  Health Maintenance: Pap:  01-22-17 negative, HR HPV negative, 01-12-16 negative, HR HPV negative  History of abnormal Pap:  Yes, treatment in hospital setting per patient MMG:  02/22/19 BIRADS 2 benign/density a Colonoscopy:  08/2014 1 polyp- f/u 2026. Sees Dr. Elesa Massed in [place]  BMD:   04/01/16  Result  Osteopenia TDaP:  2014 HIV: none Hep C: negative in the past Screening Labs:  PCP   reports that she has quit smoking. Her smoking use included cigarettes. She started smoking about 21 years ago. She has never used smokeless tobacco. She reports current alcohol use of about 3.0 standard drinks of alcohol per week. She reports that she does not use drugs.  Past Medical History:  Diagnosis Date  . CAD (coronary artery disease), native coronary artery    50% proximal RCA by heart catheterization 2004 - [facility]  . Cancer (HCC)    Basal cell on nose.  . Depression   . Dyspareunia   . Essential hypertension, benign   . GERD (gastroesophageal reflux disease)   . History of abnormal cervical Pap smear 1990  . History of hepatitis   . HSV-1 (herpes simplex virus 1) infection   . Hyperlipidemia   . Osteopenia     Past Surgical History:  Procedure Laterality Date  . APPENDECTOMY  1963  . BASAL CELL CARCINOMA EXCISION  2000   Removed from nose  .  BREAST SURGERY  04/2015   breast reduction--David Exie Parody, MD  . COLONOSCOPY N/A 08/03/2014   Procedure: COLONOSCOPY;  Surgeon: Rogene Houston, MD;  [facility];  Service: Endoscopy;  Laterality: N/A;  100  . COLPOSCOPY  1985  . ESOPHAGOGASTRODUODENOSCOPY N/A 08/03/2014   Procedure: ESOPHAGOGASTRODUODENOSCOPY (EGD);  Surgeon: Rogene Houston, MD;  [facility];  Service: Endoscopy;  Laterality: N/A;  . ESOPHAGOGASTRODUODENOSCOPY N/A 09/19/2014   Procedure: ESOPHAGOGASTRODUODENOSCOPY (EGD);  Surgeon: Rogene Houston, MD;  [facility];  Service: Endoscopy;  Laterality: N/A;  730 under fluoro in OR  . SAVORY DILATION N/A 09/19/2014   Procedure: SAVORY DILATION;  Surgeon: Rogene Houston, MD;  [facility];  Service: Endoscopy;  Laterality: N/A;  . TUBAL LIGATION  1990    Current Outpatient Medications  Medication Sig Dispense Refill  . aspirin 81 MG chewable tablet Chew by mouth daily.    . Coenzyme Q10 (CO Q 10 PO) Take 1 tablet by mouth daily.    Marland Kitchen FLUoxetine (PROZAC) 10 MG capsule Take 1 capsule by mouth daily.    . pantoprazole (PROTONIX) 40 MG tablet Take 40 mg by mouth daily.    . rosuvastatin (CRESTOR) 20 MG tablet Take 20 mg by mouth daily.    Marland Kitchen  valACYclovir (VALTREX) 500 MG tablet Take 1 tablet by mouth as needed.     No current facility-administered medications for this visit.     Family History  Problem Relation Age of Onset  . Hypertension Mother   . Osteoporosis Mother   . Hyperlipidemia Mother   . Hypertension Father   . Heart attack Father   . Hyperlipidemia Father   . Hypertension Sister   . Breast cancer Other   . Hypertension Brother   . Stroke Maternal Grandmother   . Breast cancer Paternal Grandmother     Review of Systems  Constitutional: Negative.   HENT: Negative.   Eyes: Negative.   Respiratory: Negative.   Cardiovascular: Negative.   Gastrointestinal: Negative.   Endocrine: Negative.   Genitourinary:  Negative.   Musculoskeletal: Negative.   Skin: Negative.   Allergic/Immunologic: Negative.   Neurological: Negative.   Hematological: Negative.   Psychiatric/Behavioral: Negative.     Exam:   BP 122/80 (BP Location: Right Arm, Patient Position: Sitting, Cuff Size: Normal)   Pulse 72   Temp (!) 97.2 F (36.2 C) (Temporal)   Resp 12   Ht 5' 5.5" (1.664 m)   Wt 143 lb (64.9 kg)   LMP 08/27/2003   BMI 23.43 kg/m     General appearance: alert, cooperative and appears stated age Head: normocephalic, without obvious abnormality, atraumatic Neck: no adenopathy, supple, symmetrical, trachea midline and thyroid normal to inspection and palpation Lungs: clear to auscultation bilaterally Breasts: consistent with reduction. No tenderness, No nipple retraction or dimpling, No nipple discharge or bleeding, No axillary adenopathy. Right breast with mass at 4:00 and 8:00.  Left breast with mass at 4:00 - 6:00 and 9:00. Heart: regular rate and rhythm Abdomen: soft, non-tender; no masses, no organomegaly Extremities: extremities normal, atraumatic, no cyanosis or edema Skin: skin color, texture, turgor normal. No rashes or lesions Lymph nodes: cervical, supraclavicular, and axillary nodes normal. Neurologic: grossly normal  Pelvic: External genitalia:  no lesions              No abnormal inguinal nodes palpated.              Urethra:  normal appearing urethra with no masses, tenderness or lesions              Bartholins and Skenes: normal                 Vagina: normal appearing vagina with normal color and discharge, no lesions              Cervix: no lesions              Pap taken: Yes.   Bimanual Exam:  Uterus:  normal size, contour, position, consistency, mobility, non-tender              Adnexa: no mass, fullness, tenderness              Rectal exam: Yes.  .  Confirms.              Anus:  normal sphincter tone, large hemorrhoids.  Chaperone was present for exam.  Assessment:   Well  woman visit with normal exam. Osteopenia.  Hx abnormal pap in remote past with treatment.  Status post bilateral breast reduction. Bilateral breast masses.Has a new mass on each the right and the left breast.  Hx CAD.  Hemorrhoids/anal prolapse. Hx HSV 1.   Plan:  Self breast awareness reviewed. Pap and HR HPV as above. Guidelines  for Calcium, Vitamin D, regular exercise program including cardiovascular and weight bearing exercise. Will schedule bilateral dx mammogram and bilateral US.  BMD at [facility].  Follow up annually and prn.   After visit summary provided.

## 2019-03-17 NOTE — Patient Instructions (Signed)

## 2019-03-18 LAB — CYTOLOGY - PAP
Diagnosis: NEGATIVE
HPV: NOT DETECTED

## 2019-03-20 ENCOUNTER — Telehealth: Payer: Self-pay | Admitting: Obstetrics and Gynecology

## 2019-03-20 DIAGNOSIS — N6310 Unspecified lump in the right breast, unspecified quadrant: Secondary | ICD-10-CM

## 2019-03-20 DIAGNOSIS — N6320 Unspecified lump in the left breast, unspecified quadrant: Secondary | ICD-10-CM

## 2019-03-20 NOTE — Telephone Encounter (Signed)
Please schedule a bilateral dx mammogram and bilateral ultrasound at the [place] or [facility].   She has a new mass on each the right and the left breast which were not previously palpated.

## 2019-03-22 NOTE — Telephone Encounter (Signed)
Spoke with patient, advised of appt as seen below. Patient states she will need another appt time. Provided patient with Radiology scheduling contact info, patient will contact them directly to reschedule. Patient verbalizes understanding and is agreeable.   Routing to provider for final review. Patient is agreeable to disposition. Will close encounter.

## 2019-03-22 NOTE — Telephone Encounter (Signed)
Spoke with Melinda Garza at [facility] Radiology. Scheduled for bilateral DX MMG and L/R breast US on 03/30/19 at 10:20am, arrive at 10am.

## 2019-03-30 ENCOUNTER — Ambulatory Visit (HOSPITAL_COMMUNITY)
Admission: RE | Admit: 2019-03-30 | Discharge: 2019-03-30 | Disposition: A | Discharge status: Home / Self Care | Payer: BC Managed Care – PPO | Source: Ambulatory Visit | Attending: Obstetrics and Gynecology | Admitting: Obstetrics and Gynecology

## 2019-03-30 ENCOUNTER — Encounter (HOSPITAL_COMMUNITY): Payer: BC Managed Care – PPO

## 2019-03-30 ENCOUNTER — Ambulatory Visit (HOSPITAL_COMMUNITY): Payer: BC Managed Care – PPO

## 2019-03-30 ENCOUNTER — Other Ambulatory Visit (HOSPITAL_COMMUNITY): Payer: BC Managed Care – PPO

## 2019-03-30 ENCOUNTER — Other Ambulatory Visit: Payer: Self-pay

## 2019-03-30 DIAGNOSIS — Z780 Asymptomatic menopausal state: Secondary | ICD-10-CM | POA: Insufficient documentation

## 2019-03-30 DIAGNOSIS — N6320 Unspecified lump in the left breast, unspecified quadrant: Secondary | ICD-10-CM | POA: Insufficient documentation

## 2019-03-30 DIAGNOSIS — N6310 Unspecified lump in the right breast, unspecified quadrant: Secondary | ICD-10-CM | POA: Insufficient documentation

## 2019-05-28 ENCOUNTER — Other Ambulatory Visit: Payer: Self-pay

## 2019-05-28 DIAGNOSIS — Z20822 Contact with and (suspected) exposure to covid-19: Secondary | ICD-10-CM

## 2019-05-29 LAB — NOVEL CORONAVIRUS, NAA: SARS-CoV-2, NAA: NOT DETECTED

## 2019-05-31 ENCOUNTER — Telehealth: Payer: Self-pay | Admitting: Internal Medicine

## 2019-05-31 NOTE — Telephone Encounter (Signed)
Negative COVID results given. Patient results "NOT Detected." Caller expressed understanding. ° °

## 2019-11-04 ENCOUNTER — Ambulatory Visit: Payer: Self-pay | Attending: Internal Medicine

## 2019-11-04 DIAGNOSIS — Z23 Encounter for immunization: Secondary | ICD-10-CM

## 2019-11-04 NOTE — Progress Notes (Signed)
   Covid-19 Vaccination Clinic  Name:  Melinda Garza    MRN: [id number] DOB: 06-13-1956  11/04/2019  [name] was observed post Covid-19 immunization for 15 minutes without incident. She was provided with Vaccine Information Sheet and instruction to access the V-Safe system.   [name] was instructed to call 911 with any severe reactions post vaccine: Marland Kitchen Difficulty breathing  . Swelling of face and throat  . A fast heartbeat  . A bad rash all over body  . Dizziness and weakness   Immunizations Administered    Name Date Dose VIS Date Route   Moderna COVID-19 Vaccine 11/04/2019 11:07 AM 0.5 mL 07/27/2019 Intramuscular   Manufacturer: Moderna   Lot: [id number]   [id number]

## 2019-12-01 ENCOUNTER — Ambulatory Visit: Payer: Self-pay | Attending: Internal Medicine

## 2019-12-01 DIAGNOSIS — Z23 Encounter for immunization: Secondary | ICD-10-CM

## 2019-12-01 NOTE — Progress Notes (Signed)
   Covid-19 Vaccination Clinic  Name:  Melinda Garza    MRN: [id number] DOB: 1956-07-14  12/01/2019  [name] was observed post Covid-19 immunization for 15 minutes without incident. She was provided with Vaccine Information Sheet and instruction to access the V-Safe system.   [name] was instructed to call 911 with any severe reactions post vaccine: Marland Kitchen Difficulty breathing  . Swelling of face and throat  . A fast heartbeat  . A bad rash all over body  . Dizziness and weakness   Immunizations Administered    Name Date Dose VIS Date Route   Moderna COVID-19 Vaccine 12/01/2019 11:45 AM 0.5 mL 07/27/2019 Intramuscular   Manufacturer: Levan Hurst   LotFY:[id number]   [id number]

## 2019-12-07 ENCOUNTER — Ambulatory Visit: Payer: Self-pay

## 2020-04-05 ENCOUNTER — Telehealth: Payer: Self-pay | Admitting: Dermatology

## 2020-04-05 NOTE — Telephone Encounter (Signed)
Established patient called, said referral coming from Dr Hilma Favors, [place]. Scheduled 05/10/20 @10 :42 w/ST

## 2020-04-05 NOTE — Telephone Encounter (Signed)
Patient called, said Dr. Hilma Favors, [place],  sent over referral. She's established, scheduled w/ST 05/10/20 @10 :13

## 2020-04-07 ENCOUNTER — Other Ambulatory Visit (HOSPITAL_COMMUNITY): Payer: Self-pay | Admitting: Family Medicine

## 2020-04-07 DIAGNOSIS — E2839 Other primary ovarian failure: Secondary | ICD-10-CM

## 2020-04-07 DIAGNOSIS — Z1231 Encounter for screening mammogram for malignant neoplasm of breast: Secondary | ICD-10-CM

## 2020-05-08 ENCOUNTER — Ambulatory Visit (HOSPITAL_COMMUNITY)
Admission: RE | Admit: 2020-05-08 | Discharge: 2020-05-08 | Disposition: A | Discharge status: Home / Self Care | Payer: Medicare PPO | Source: Ambulatory Visit | Attending: Family Medicine | Admitting: Family Medicine

## 2020-05-08 ENCOUNTER — Ambulatory Visit (HOSPITAL_COMMUNITY): Payer: Self-pay

## 2020-05-08 ENCOUNTER — Other Ambulatory Visit: Payer: Self-pay

## 2020-05-08 DIAGNOSIS — Z1231 Encounter for screening mammogram for malignant neoplasm of breast: Secondary | ICD-10-CM

## 2020-05-10 ENCOUNTER — Ambulatory Visit: Payer: Medicare PPO | Admitting: Dermatology

## 2020-05-10 ENCOUNTER — Encounter: Payer: Self-pay | Admitting: Dermatology

## 2020-05-10 ENCOUNTER — Other Ambulatory Visit: Payer: Self-pay

## 2020-05-10 DIAGNOSIS — L821 Other seborrheic keratosis: Secondary | ICD-10-CM | POA: Diagnosis not present

## 2020-05-10 DIAGNOSIS — B351 Tinea unguium: Secondary | ICD-10-CM | POA: Diagnosis not present

## 2020-06-15 NOTE — Progress Notes (Signed)
   Follow-Up Visit   Subjective  Melinda Garza is a 64 y.o. female who presents for the following: Skin Problem (left chest x  months, left lower leg- spot checked, ? left great toe - fungus).  Skin problem and toe nail fungus.  Location:  Duration:  Quality:  Associated Signs/Symptoms: Modifying Factors:  Severity:  Timing: Context:   Objective  Well appearing patient in no apparent distress; mood and affect are within normal limits.  A focused examination was performed including chest, legs and toenail. . Relevant physical exam findings are noted in the Assessment and Plan.   Assessment & Plan    Nail fungus Left great toenail  Discussed essentially all treatment options but no intervention for now  Seborrheic keratosis (2) Left Lower Leg - Anterior; Chest - Medial [facility])  May leave if stable.     I, Lavonna Monarch, MD, have reviewed all documentation for this visit.  The documentation on 06/16/20 for the exam, diagnosis, procedures, and orders are all accurate and complete.

## 2020-06-16 ENCOUNTER — Encounter: Payer: Self-pay | Admitting: Dermatology

## 2020-06-28 DIAGNOSIS — F418 Other specified anxiety disorders: Secondary | ICD-10-CM | POA: Diagnosis not present

## 2020-07-11 DIAGNOSIS — R002 Palpitations: Secondary | ICD-10-CM | POA: Diagnosis not present

## 2020-07-11 DIAGNOSIS — Z6822 Body mass index (BMI) 22.0-22.9, adult: Secondary | ICD-10-CM | POA: Diagnosis not present

## 2020-08-01 NOTE — Progress Notes (Signed)
Cardiology Office Note:   Date:  08/03/2020  NAME:  Melinda Garza    MRN: [id number] DOB:  [birth year]   PCP:  Sharilyn Sites, MD  Cardiologist:  No primary care provider on file.  Electrophysiologist:  None   Referring MD: Sharilyn Sites, MD   Chief Complaint  Patient presents with  . Palpitations        History of Present Illness:   Melinda Garza is a 64 y.o. female with a hx of PVCs, non-obstructive CAD, HLD who is being seen today for the evaluation of palpitations at the request of Sharilyn Sites, MD.  She reports the last 2 months has had episodes of palpitations.  They occur every day.  She reports that improved somewhat.  They're occurring two times per day.  They can last up to 30 seconds.  They occur at any time.  No alleviating factors reported.  No identifiable triggers.  She reports no excess caffeine consumption.  Associated symptoms can include dizziness.  She exercises 6 days/week.  She does the P90 X workouts.  Workouts are up to 60 minutes to 90 minutes.  She reports no chest pain or shortness of breath with these episodes.  Sometimes she can feel her heart racing.  Symptoms also occur at night.  EKG today shows normal sinus rhythm with no acute ischemic changes.  She apparently had elevation 2004.  There was mention of a 50% proximal RCA lesion.  There is also mention of spasm.  Her most recent LDL cholesterol goal despite being on Crestor 20 mg a day.  I also recommended she go back on her aspirin.  She is a never smoker.  She does not drink alcohol in excess.  No drug use reported.  She does report some stress at home.  EKG is normal.  Family history of CAD in her father  Total cholesterol 171, HDL 57, LDL 93, triglycerides 118, TSH 1.1  Problem List 1. Non-obstructive CAD -50% pRCA 2004 2. HLD 3. PVCs  Past Medical History: Past Medical History:  Diagnosis Date  . CAD (coronary artery disease), native coronary artery    50% proximal RCA by heart catheterization 2004 -  [facility]  . Cancer (HCC)    Basal cell on nose.  . Depression   . Dyspareunia   . Essential hypertension, benign   . GERD (gastroesophageal reflux disease)   . History of abnormal cervical Pap smear 1990  . History of hepatitis   . HSV-1 (herpes simplex virus 1) infection   . Hyperlipidemia   . Osteopenia     Past Surgical History: Past Surgical History:  Procedure Laterality Date  . APPENDECTOMY  1963  . BASAL CELL CARCINOMA EXCISION  2000   Removed from nose  . BREAST SURGERY  04/2015   breast reduction--David Exie Parody, MD  . [place]    . COLONOSCOPY N/A 08/03/2014   Procedure: COLONOSCOPY;  Surgeon: Rogene Houston, MD;  [facility];  Service: Endoscopy;  Laterality: N/A;  100  . COLPOSCOPY  1985  . ESOPHAGOGASTRODUODENOSCOPY N/A 08/03/2014   Procedure: ESOPHAGOGASTRODUODENOSCOPY (EGD);  Surgeon: Rogene Houston, MD;  [facility];  Service: Endoscopy;  Laterality: N/A;  . ESOPHAGOGASTRODUODENOSCOPY N/A 09/19/2014   Procedure: ESOPHAGOGASTRODUODENOSCOPY (EGD);  Surgeon: Rogene Houston, MD;  [facility];  Service: Endoscopy;  Laterality: N/A;  730 under fluoro in OR  . SAVORY DILATION N/A 09/19/2014   Procedure: SAVORY DILATION;  Surgeon: Rogene Houston, MD;  [facility];  Service: Endoscopy;  Laterality: N/A;  . TUBAL LIGATION  1990    Current Medications: Current Meds  Medication Sig  . Coenzyme Q10 (CO Q 10 PO) Take 1 tablet by mouth daily.  . pantoprazole (PROTONIX) 40 MG tablet Take 40 mg by mouth daily.  . valACYclovir (VALTREX) 500 MG tablet Take 1 tablet by mouth as needed.  . [DISCONTINUED] pantoprazole (PROTONIX) 40 MG tablet Take 40 mg by mouth daily.  . [DISCONTINUED] rosuvastatin (CRESTOR) 20 MG tablet Take 20 mg by mouth daily.     Allergies:    Sulfonamide derivatives   Social History: Social History   Socioeconomic History  . Marital status: Married    Spouse name: Not on file  . Number of  children: 2  . Years of education: Not on file  . Highest education level: Not on file  Occupational History  . Occupation: retired    Fish farm manager: [employer]  Tobacco Use  . Smoking status: Never Smoker  . Smokeless tobacco: Never Used  Vaping Use  . Vaping Use: Never used  Substance and Sexual Activity  . Alcohol use: Yes    Alcohol/week: 3.0 standard drinks    Types: 3 Standard drinks or equivalent per week    Comment: Occasional wine, beer  . Drug use: No  . Sexual activity: Yes    Partners: Male    Birth control/protection: Post-menopausal, Surgical    Comment: BTSP  Other Topics Concern  . Not on file  Social History Narrative  . Not on file   Social Determinants of Health   Financial Resource Strain: Not on file  Food Insecurity: Not on file  Transportation Needs: Not on file  Physical Activity: Not on file  Stress: Not on file  Social Connections: Not on file     Family History: The patient's family history includes Breast cancer in her paternal grandmother and another family member; Heart attack in her father; Hyperlipidemia in her father and mother; Hypertension in her brother, father, mother, and sister; Osteoporosis in her mother; Stroke in her maternal grandmother.  ROS:   All other ROS reviewed and negative. Pertinent positives noted in the HPI.     EKGs/Labs/Other Studies Reviewed:   The following studies were personally reviewed by me today:  EKG:  EKG is ordered today.  The ekg ordered today demonstrates sinus rhythm, heart rate 67, no acute ischemic changes or evidence of prior infarction, and was personally reviewed by me.   Recent Labs: No results found for requested labs within last 8760 hours.   Recent Lipid Panel    Component Value Date/Time   CHOL 152 10/24/2008 2037   TRIG 64 10/24/2008 2037   HDL 46 10/24/2008 2037   CHOLHDL 3.3 Ratio 10/24/2008 2037   VLDL 13 10/24/2008 2037   LDLCALC 93 10/24/2008 2037    Physical Exam:   VS:  BP  123/87   Pulse 67   Ht 5\' 5"  (1.651 m)   Wt 143 lb 9.6 oz (65.1 kg)   LMP 08/27/2003   SpO2 98%   BMI 23.90 kg/m    Wt Readings from Last 3 Encounters:  08/03/20 143 lb 9.6 oz (65.1 kg)  03/17/19 143 lb (64.9 kg)  02/20/18 135 lb 6.4 oz (61.4 kg)    General: Well nourished, well developed, in no acute distress Head: Atraumatic, normal size  Eyes: PEERLA, EOMI  Neck: Supple, no JVD Endocrine: No thryomegaly Cardiac: Normal S1, S2; RRR; no  murmurs, rubs, or gallops Lungs: Clear to auscultation bilaterally, no wheezing, rhonchi or rales  Abd: Soft, nontender, no hepatomegaly  Ext: No edema, pulses 2+ Musculoskeletal: No deformities, BUE and BLE strength normal and equal Skin: Warm and dry, no rashes   Neuro: Alert and oriented to person, place, time, and situation, CNII-XII grossly intact, no focal deficits  Psych: Normal mood and affect   ASSESSMENT:   HENYA AGUALLO is a 64 y.o. female who presents for the following: 1. Palpitations   2. PVC (premature ventricular contraction)   3. Coronary artery disease involving native coronary artery of native heart without angina pectoris   4. Mixed hyperlipidemia     PLAN:   1. Palpitations 2. PVC (premature ventricular contraction) -History of PVCs.  EKG is normal.  Symptoms of palpitations.  Could be stress or recurrence of an arrhythmia.  I recommended a 7-day Zio patch.  Recent thyroid studies are normal.  She is not anemic.  No excess caffeine consumption.  Her cardiovascular exam is normal.  To refine arrhythmia she will need an echocardiogram.  We will start with this first.  3. Coronary artery disease involving native coronary artery of native heart without angina pectoris 4. Mixed hyperlipidemia -50% proximal RCA lesion in 2004.  Most recent LDL is not less than 70.  We will increase her Crestor to 40 mg a day.  She'll see me back in 3 months and we'll repeat a lipid profile fasting 1 week before.   Disposition: Return in  about 3 months (around 11/01/2020).  Medication Adjustments/Labs and Tests Ordered: Current medicines are reviewed at length with the patient today.  Concerns regarding medicines are outlined above.  Orders Placed This Encounter  Procedures  . Lipid panel  . LONG TERM MONITOR (3-14 DAYS)  . EKG 12-Lead   Meds ordered this encounter  Medications  . rosuvastatin (CRESTOR) 40 MG tablet    Sig: Take 1 tablet (40 mg total) by mouth daily.    Dispense:  90 tablet    Refill:  3    Patient Instructions  Medication Instructions:  INCREASE CRESTOR TO 40mg  DAILY  *If you need a refill on your cardiac medications before your next appointment, please call your pharmacy*  Lab Work: PLEASE HAVE REPEAT LIPID DRAWN 1 WEEK BEFORE NEXT APPOINTMENT WITH Dr. Audie Box  If you have labs (blood work) drawn today and your tests are completely normal, you will receive your results only by: Marland Kitchen MyChart Message (if you have MyChart) OR . A paper copy in the mail If you have any lab test that is abnormal or we need to change your treatment, we will call you to review the results.  Testing/Procedures:  [facility]- Long Term Monitor Instructions   Your physician has requested you wear your ZIO patch monitor 7 days.   This is a single patch monitor.  [employer] supplies one patch monitor per enrollment.  Additional stickers are not available.   Please do not apply patch if you will be having a Nuclear Stress Test, Echocardiogram, Cardiac CT, MRI, or Chest Xray during the time frame you would be wearing the monitor. The patch cannot be worn during these tests.  You cannot remove and re-apply the ZIO XT patch monitor.   Your ZIO patch monitor will be sent USPS Priority mail from [facility] directly to your home address. The monitor may also be mailed to a PO BOX if home delivery is not available.   It may take 3-5  days to receive your monitor after you have been enrolled.   Once you have received you monitor,  please review enclosed instructions.  Your monitor has already been registered assigning a specific monitor serial # to you.   Applying the monitor   Shave hair from upper left chest.   Hold abrader disc by orange tab.  Rub abrader in 40 strokes over left upper chest as indicated in your monitor instructions.   Clean area with 4 enclosed alcohol pads .  Use all pads to assure are is cleaned thoroughly.  Let dry.   Apply patch as indicated in monitor instructions.  Patch will be place under collarbone on left side of chest with arrow pointing upward.   Rub patch adhesive wings for 2 minutes.Remove white label marked "1".  Remove white label marked "2".  Rub patch adhesive wings for 2 additional minutes.   While looking in a mirror, press and release button in center of patch.  A small green light will flash 3-4 times .  This will be your only indicator the monitor has been turned on.     Do not shower for the first 24 hours.  You may shower after the first 24 hours.   Press button if you feel a symptom. You will hear a small click.  Record Date, Time and Symptom in the Patient Log Book.   When you are ready to remove patch, follow instructions on last 2 pages of Patient Log Book.  Stick patch monitor onto last page of Patient Log Book.   Place Patient Log Book in [place] box.  Use locking tab on box and tape box closed securely.  The [employer] has [employer] on it.  Please place in mailbox as soon as possible.  Your physician should have your test results approximately 7 days after the monitor has been mailed back to [facility].   Call [place] at [phone] if you have questions regarding your ZIO XT patch monitor.  Call them immediately if you see an orange light blinking on your monitor.   If your monitor falls off in less than 4 days contact our Monitor department at [phone].  If your monitor becomes loose or falls off after 4 days call  [employer] at [phone] for suggestions on securing your monitor.   Follow-Up: At [facility], you and your health needs are our priority.  As part of our continuing mission to provide you with exceptional heart care, we have created designated Provider Care Teams.  These Care Teams include your primary Cardiologist (physician) and Advanced Practice Providers (APPs -  Physician Assistants and Nurse Practitioners) who all work together to provide you with the care you need, when you need it.  We recommend signing up for the patient portal called "MyChart".  Sign up information is provided on this After Visit Summary.  MyChart is used to connect with patients for Virtual Visits (Telemedicine).  Patients are able to view lab/test results, encounter notes, upcoming appointments, etc.  Non-urgent messages can be sent to your provider as well.   To learn more about what you can do with MyChart, go to [email or web address].    Your next appointment:   3 month(s)  The format for your next appointment:   In Person  Provider:   Eleonore Chiquito, MD    Signed, Addison Naegeli. Audie Box, [place]  [address], [place], [place] [address] [phone]  08/03/2020 2:19  PM

## 2020-08-03 ENCOUNTER — Encounter: Payer: Self-pay | Admitting: Cardiovascular Disease

## 2020-08-03 ENCOUNTER — Ambulatory Visit: Payer: Medicare PPO | Admitting: Cardiovascular Disease

## 2020-08-03 ENCOUNTER — Other Ambulatory Visit: Payer: Self-pay

## 2020-08-03 ENCOUNTER — Telehealth: Payer: Self-pay | Admitting: Radiology

## 2020-08-03 VITALS — BP 123/87 | HR 67 | Ht 65.0 in | Wt 143.6 lb

## 2020-08-03 DIAGNOSIS — E782 Mixed hyperlipidemia: Secondary | ICD-10-CM

## 2020-08-03 DIAGNOSIS — R002 Palpitations: Secondary | ICD-10-CM | POA: Diagnosis not present

## 2020-08-03 DIAGNOSIS — I493 Ventricular premature depolarization: Secondary | ICD-10-CM

## 2020-08-03 DIAGNOSIS — I2510 Atherosclerotic heart disease of native coronary artery without angina pectoris: Secondary | ICD-10-CM

## 2020-08-03 MED ORDER — ROSUVASTATIN CALCIUM 40 MG PO TABS: 40.0000 mg | ORAL_TABLET | Freq: Every day | ORAL | 3 refills | Status: DC

## 2020-08-03 NOTE — Patient Instructions (Signed)
Medication Instructions:  INCREASE CRESTOR TO 40mg  DAILY  *If you need a refill on your cardiac medications before your next appointment, please call your pharmacy*  Lab Work: PLEASE HAVE REPEAT LIPID DRAWN 1 WEEK BEFORE NEXT APPOINTMENT WITH Dr. Audie Box  If you have labs (blood work) drawn today and your tests are completely normal, you will receive your results only by: Marland Kitchen MyChart Message (if you have MyChart) OR . A paper copy in the mail If you have any lab test that is abnormal or we need to change your treatment, we will call you to review the results.  Testing/Procedures:  [facility]- Long Term Monitor Instructions   Your physician has requested you wear your ZIO patch monitor 7 days.   This is a single patch monitor.  [employer] supplies one patch monitor per enrollment.  Additional stickers are not available.   Please do not apply patch if you will be having a Nuclear Stress Test, Echocardiogram, Cardiac CT, MRI, or Chest Xray during the time frame you would be wearing the monitor. The patch cannot be worn during these tests.  You cannot remove and re-apply the ZIO XT patch monitor.   Your ZIO patch monitor will be sent USPS Priority mail from [facility] directly to your home address. The monitor may also be mailed to a PO BOX if home delivery is not available.   It may take 3-5 days to receive your monitor after you have been enrolled.   Once you have received you monitor, please review enclosed instructions.  Your monitor has already been registered assigning a specific monitor serial # to you.   Applying the monitor   Shave hair from upper left chest.   Hold abrader disc by orange tab.  Rub abrader in 40 strokes over left upper chest as indicated in your monitor instructions.   Clean area with 4 enclosed alcohol pads .  Use all pads to assure are is cleaned thoroughly.  Let dry.   Apply patch as indicated in monitor instructions.  Patch will be place under collarbone on  left side of chest with arrow pointing upward.   Rub patch adhesive wings for 2 minutes.Remove white label marked "1".  Remove white label marked "2".  Rub patch adhesive wings for 2 additional minutes.   While looking in a mirror, press and release button in center of patch.  A small green light will flash 3-4 times .  This will be your only indicator the monitor has been turned on.     Do not shower for the first 24 hours.  You may shower after the first 24 hours.   Press button if you feel a symptom. You will hear a small click.  Record Date, Time and Symptom in the Patient Log Book.   When you are ready to remove patch, follow instructions on last 2 pages of Patient Log Book.  Stick patch monitor onto last page of Patient Log Book.   Place Patient Log Book in [place] box.  Use locking tab on box and tape box closed securely.  The [employer] has [employer] on it.  Please place in mailbox as soon as possible.  Your physician should have your test results approximately 7 days after the monitor has been mailed back to [facility].   Call [place] at [phone] if you have questions regarding your ZIO XT patch monitor.  Call them immediately if you see an orange light blinking on your  monitor.   If your monitor falls off in less than 4 days contact our Monitor department at [phone].  If your monitor becomes loose or falls off after 4 days call [employer] at [phone] for suggestions on securing your monitor.   Follow-Up: At [facility], you and your health needs are our priority.  As part of our continuing mission to provide you with exceptional heart care, we have created designated Provider Care Teams.  These Care Teams include your primary Cardiologist (physician) and Advanced Practice Providers (APPs -  Physician Assistants and Nurse Practitioners) who all work together to provide you with the care you need, when you need it.  We recommend  signing up for the patient portal called "MyChart".  Sign up information is provided on this After Visit Summary.  MyChart is used to connect with patients for Virtual Visits (Telemedicine).  Patients are able to view lab/test results, encounter notes, upcoming appointments, etc.  Non-urgent messages can be sent to your provider as well.   To learn more about what you can do with MyChart, go to [email or web address].    Your next appointment:   3 month(s)  The format for your next appointment:   In Person  Provider:   Eleonore Chiquito, MD

## 2020-08-03 NOTE — Telephone Encounter (Signed)
Enrolled patient for a 7 day Zio XT monitor to be mailed to patients home.  

## 2020-08-08 ENCOUNTER — Ambulatory Visit (INDEPENDENT_AMBULATORY_CARE_PROVIDER_SITE_OTHER): Payer: Medicare PPO

## 2020-08-08 DIAGNOSIS — R002 Palpitations: Secondary | ICD-10-CM | POA: Diagnosis not present

## 2020-08-21 ENCOUNTER — Ambulatory Visit: Payer: Medicare PPO | Admitting: Dermatology

## 2020-08-23 DIAGNOSIS — R002 Palpitations: Secondary | ICD-10-CM | POA: Diagnosis not present

## 2020-11-08 DIAGNOSIS — E782 Mixed hyperlipidemia: Secondary | ICD-10-CM | POA: Diagnosis not present

## 2020-11-08 DIAGNOSIS — I2510 Atherosclerotic heart disease of native coronary artery without angina pectoris: Secondary | ICD-10-CM | POA: Diagnosis not present

## 2020-11-09 ENCOUNTER — Ambulatory Visit: Payer: Medicare PPO | Admitting: Cardiovascular Disease

## 2020-11-09 LAB — LIPID PANEL
Chol/HDL Ratio: 2.3 ratio (ref 0.0–4.4)
Cholesterol, Total: 156 mg/dL (ref 100–199)
HDL: 68 mg/dL (ref >39)
LDL Chol Calc (NIH): 73 mg/dL (ref 0–99)
Triglycerides: 80 mg/dL (ref 0–149)
VLDL Cholesterol Cal: 15 mg/dL (ref 5–40)

## 2020-11-09 NOTE — Progress Notes (Signed)
We will discuss in office next week.  [place] T. Audie Box, MD, [facility]  [address], [place], [place] [address] [phone]  9:48 AM

## 2020-11-15 NOTE — Progress Notes (Unsigned)
Cardiology Office Note:   Date:  11/16/2020  NAME:  Melinda Garza    MRN: [id number] DOB:  10/12/1955   PCP:  Sharilyn Sites, MD  Cardiologist:  No primary care provider on file.  Electrophysiologist:  None   Referring MD: Sharilyn Sites, MD   Chief Complaint  Patient presents with  . Follow-up   History of Present Illness:   Melinda Garza is a 65 y.o. female with a hx of nonobstructive CAD, hyperlipidemia, PVCs who presents for follow-up.  We started on Crestor.  Most recent LDL cholesterol close to goal. Doing well. Denies CP/SOB. Reports she is still having episodes of pounding.  Symptoms occur once per day.  We did go over her monitor in the office.  Her pounding occurs with sinus rhythm.  I suspect she is just stressed out.  She does report some anxiety.  Her LDL cholesterol is at goal.  She denies any significant chest pain.  She reports she is doing P90 X and she recently went skiing.  She can complete hours of physical activity without any major limitations.  Body mass index is normal.  Her BP is 120/62.   Problem List 1. Non-obstructive CAD -50% pRCA 2004 2. HLD -T chol 156, HLD 68, LDL 73, TG 80 3. PVCs -<1% burden  Past Medical History: Past Medical History:  Diagnosis Date  . CAD (coronary artery disease), native coronary artery    50% proximal RCA by heart catheterization 2004 - [facility]  . Cancer (HCC)    Basal cell on nose.  . Depression   . Dyspareunia   . Essential hypertension, benign   . GERD (gastroesophageal reflux disease)   . History of abnormal cervical Pap smear 1990  . History of hepatitis   . HSV-1 (herpes simplex virus 1) infection   . Hyperlipidemia   . Osteopenia     Past Surgical History: Past Surgical History:  Procedure Laterality Date  . APPENDECTOMY  1963  . BASAL CELL CARCINOMA EXCISION  2000   Removed from nose  . BREAST SURGERY  04/2015   breast reduction--David Exie Parody, MD  . [place]    . COLONOSCOPY N/A 08/03/2014    Procedure: COLONOSCOPY;  Surgeon: Rogene Houston, MD;  [facility];  Service: Endoscopy;  Laterality: N/A;  100  . COLPOSCOPY  1985  . ESOPHAGOGASTRODUODENOSCOPY N/A 08/03/2014   Procedure: ESOPHAGOGASTRODUODENOSCOPY (EGD);  Surgeon: Rogene Houston, MD;  [facility];  Service: Endoscopy;  Laterality: N/A;  . ESOPHAGOGASTRODUODENOSCOPY N/A 09/19/2014   Procedure: ESOPHAGOGASTRODUODENOSCOPY (EGD);  Surgeon: Rogene Houston, MD;  [facility];  Service: Endoscopy;  Laterality: N/A;  730 under fluoro in OR  . SAVORY DILATION N/A 09/19/2014   Procedure: SAVORY DILATION;  Surgeon: Rogene Houston, MD;  [facility];  Service: Endoscopy;  Laterality: N/A;  . TUBAL LIGATION  1990    Current Medications: Current Meds  Medication Sig  . aspirin 81 MG chewable tablet Chew by mouth daily.  . Coenzyme Q10 (CO Q 10 PO) Take 1 tablet by mouth daily.  . pantoprazole (PROTONIX) 40 MG tablet Take 40 mg by mouth daily.  . rosuvastatin (CRESTOR) 40 MG tablet Take 1 tablet (40 mg total) by mouth daily.  . valACYclovir (VALTREX) 500 MG tablet Take 1 tablet by mouth as needed.     Allergies:    Sulfonamide derivatives   Social History: Social History   Socioeconomic History  . Marital status: Married  Spouse name: Not on file  . Number of children: 2  . Years of education: Not on file  . Highest education level: Not on file  Occupational History  . Occupation: retired    Fish farm manager: [employer]  Tobacco Use  . Smoking status: Never Smoker  . Smokeless tobacco: Never Used  Vaping Use  . Vaping Use: Never used  Substance and Sexual Activity  . Alcohol use: Yes    Alcohol/week: 3.0 standard drinks    Types: 3 Standard drinks or equivalent per week    Comment: Occasional wine, beer  . Drug use: No  . Sexual activity: Yes    Partners: Male    Birth control/protection: Post-menopausal, Surgical    Comment: BTSP  Other Topics Concern  . Not on file   Social History Narrative  . Not on file   Social Determinants of Health   Financial Resource Strain: Not on file  Food Insecurity: Not on file  Transportation Needs: Not on file  Physical Activity: Not on file  Stress: Not on file  Social Connections: Not on file    Family History: The patient's family history includes Breast cancer in her paternal grandmother and another family member; Heart attack in her father; Hyperlipidemia in her father and mother; Hypertension in her brother, father, mother, and sister; Osteoporosis in her mother; Stroke in her maternal grandmother.  ROS:   All other ROS reviewed and negative. Pertinent positives noted in the HPI.     EKGs/Labs/Other Studies Reviewed:   The following studies were personally reviewed by me today:  Zio 08/24/2020 1. Brief ectopic atrial tachycardia episodes detected (4 in 7 days) that did not coincided with symptoms.  2. Rare ectopy.   Recent Labs: No results found for requested labs within last 8760 hours.   Recent Lipid Panel    Component Value Date/Time   CHOL 156 11/08/2020 1002   TRIG 80 11/08/2020 1002   HDL 68 11/08/2020 1002   CHOLHDL 2.3 11/08/2020 1002   CHOLHDL 3.3 Ratio 10/24/2008 2037   VLDL 13 10/24/2008 2037   LDLCALC 73 11/08/2020 1002    Physical Exam:   VS:  BP 120/62 (BP Location: Right Arm, Patient Position: Sitting, Cuff Size: Normal)   Pulse 62   Ht 5' 5.5" (1.664 m)   Wt 131 lb 3.2 oz (59.5 kg)   LMP 08/27/2003   SpO2 99%   BMI 21.50 kg/m    Wt Readings from Last 3 Encounters:  11/16/20 131 lb 3.2 oz (59.5 kg)  08/03/20 143 lb 9.6 oz (65.1 kg)  03/17/19 143 lb (64.9 kg)    General: Well nourished, well developed, in no acute distress Head: Atraumatic, normal size  Eyes: PEERLA, EOMI  Neck: Supple, no JVD Endocrine: No thryomegaly Cardiac: Normal S1, S2; RRR; no murmurs, rubs, or gallops Lungs: Clear to auscultation bilaterally, no wheezing, rhonchi or rales  Abd: Soft,  nontender, no hepatomegaly  Ext: No edema, pulses 2+ Musculoskeletal: No deformities, BUE and BLE strength normal and equal Skin: Warm and dry, no rashes   Neuro: Alert and oriented to person, place, time, and situation, CNII-XII grossly intact, no focal deficits  Psych: Normal mood and affect   ASSESSMENT:   Melinda Garza is a 65 y.o. female who presents for the following: 1. Palpitations   2. PVC (premature ventricular contraction)   3. Coronary artery disease involving native coronary artery of native heart without angina pectoris   4. Mixed hyperlipidemia  PLAN:   1. Palpitations 2. PVC (premature ventricular contraction) -Still having episodes of palpitations.  Monitor showed no significant arrhythmias.  I suspect her symptoms are due to stress related.  She has less than 1% PVC burden.  I see no need for treatment of this.  We discussed the cardia mobile device.  Hopefully she can look into this.  This would be a good way for her monitor her heart rhythm at home and to further reassure her that everything is okay.  She will look into this.  3. Coronary artery disease involving native coronary artery of native heart without angina pectoris 4. Mixed hyperlipidemia -Left heart cath in 2004 with reported 50% RCA lesion.  No symptoms of angina.  I recommended he continue aspirin.  She is on Crestor.  Most recent LDL cholesterol 73.  I think this is acceptable.  Her HDL cholesterol is exceedingly high.   Disposition: Return in about 1 year (around 11/16/2021).  Medication Adjustments/Labs and Tests Ordered: Current medicines are reviewed at length with the patient today.  Concerns regarding medicines are outlined above.  No orders of the defined types were placed in this encounter.  No orders of the defined types were placed in this encounter.   Patient Instructions  Medication Instructions:  The current medical regimen is effective;  continue present plan and  medications.  *If you need a refill on your cardiac medications before your next appointment, please call your pharmacy*   Follow-Up: At [facility], you and your health needs are our priority.  As part of our continuing mission to provide you with exceptional heart care, we have created designated Provider Care Teams.  These Care Teams include your primary Cardiologist (physician) and Advanced Practice Providers (APPs -  Physician Assistants and Nurse Practitioners) who all work together to provide you with the care you need, when you need it.  We recommend signing up for the patient portal called "MyChart".  Sign up information is provided on this After Visit Summary.  MyChart is used to connect with patients for Virtual Visits (Telemedicine).  Patients are able to view lab/test results, encounter notes, upcoming appointments, etc.  Non-urgent messages can be sent to your provider as well.   To learn more about what you can do with MyChart, go to [email or web address].    Your next appointment:   12 month(s)  The format for your next appointment:   In Person  Provider:   Eleonore Chiquito, MD        Time Spent with Patient: I have spent a total of 25 minutes with patient reviewing hospital notes, telemetry, EKGs, labs and examining the patient as well as establishing an assessment and plan that was discussed with the patient.  > 50% of time was spent in direct patient care.  Signed, Addison Naegeli. Audie Box, MD, [place]  [address] [place], [place] [address] [phone]  11/16/2020 1:43 PM

## 2020-11-16 ENCOUNTER — Encounter: Payer: Self-pay | Admitting: Cardiovascular Disease

## 2020-11-16 ENCOUNTER — Other Ambulatory Visit: Payer: Self-pay

## 2020-11-16 ENCOUNTER — Ambulatory Visit: Payer: Medicare PPO | Admitting: Cardiovascular Disease

## 2020-11-16 VITALS — BP 120/62 | HR 62 | Ht 65.5 in | Wt 131.2 lb

## 2020-11-16 DIAGNOSIS — I2510 Atherosclerotic heart disease of native coronary artery without angina pectoris: Secondary | ICD-10-CM | POA: Diagnosis not present

## 2020-11-16 DIAGNOSIS — E782 Mixed hyperlipidemia: Secondary | ICD-10-CM

## 2020-11-16 DIAGNOSIS — R002 Palpitations: Secondary | ICD-10-CM | POA: Diagnosis not present

## 2020-11-16 DIAGNOSIS — I493 Ventricular premature depolarization: Secondary | ICD-10-CM | POA: Diagnosis not present

## 2020-11-16 NOTE — Patient Instructions (Signed)

## 2020-11-30 DIAGNOSIS — F418 Other specified anxiety disorders: Secondary | ICD-10-CM | POA: Diagnosis not present

## 2020-12-18 DIAGNOSIS — F418 Other specified anxiety disorders: Secondary | ICD-10-CM | POA: Diagnosis not present

## 2020-12-19 DIAGNOSIS — J22 Unspecified acute lower respiratory infection: Secondary | ICD-10-CM | POA: Diagnosis not present

## 2020-12-19 DIAGNOSIS — J301 Allergic rhinitis due to pollen: Secondary | ICD-10-CM | POA: Diagnosis not present

## 2020-12-19 DIAGNOSIS — Z681 Body mass index (BMI) 19 or less, adult: Secondary | ICD-10-CM | POA: Diagnosis not present

## 2021-01-01 DIAGNOSIS — F418 Other specified anxiety disorders: Secondary | ICD-10-CM | POA: Diagnosis not present

## 2021-01-25 DIAGNOSIS — F418 Other specified anxiety disorders: Secondary | ICD-10-CM | POA: Diagnosis not present

## 2021-03-07 DIAGNOSIS — F418 Other specified anxiety disorders: Secondary | ICD-10-CM | POA: Diagnosis not present

## 2021-04-05 ENCOUNTER — Other Ambulatory Visit (HOSPITAL_COMMUNITY): Payer: Self-pay | Admitting: Family Medicine

## 2021-04-05 DIAGNOSIS — Z1231 Encounter for screening mammogram for malignant neoplasm of breast: Secondary | ICD-10-CM

## 2021-04-10 DIAGNOSIS — Z0000 Encounter for general adult medical examination without abnormal findings: Secondary | ICD-10-CM | POA: Diagnosis not present

## 2021-04-10 DIAGNOSIS — R7309 Other abnormal glucose: Secondary | ICD-10-CM | POA: Diagnosis not present

## 2021-04-10 DIAGNOSIS — Z6821 Body mass index (BMI) 21.0-21.9, adult: Secondary | ICD-10-CM | POA: Diagnosis not present

## 2021-04-10 DIAGNOSIS — I471 Supraventricular tachycardia: Secondary | ICD-10-CM | POA: Diagnosis not present

## 2021-04-10 DIAGNOSIS — Z1389 Encounter for screening for other disorder: Secondary | ICD-10-CM | POA: Diagnosis not present

## 2021-04-10 DIAGNOSIS — F418 Other specified anxiety disorders: Secondary | ICD-10-CM | POA: Diagnosis not present

## 2021-04-10 DIAGNOSIS — E782 Mixed hyperlipidemia: Secondary | ICD-10-CM | POA: Diagnosis not present

## 2021-04-10 DIAGNOSIS — Z1331 Encounter for screening for depression: Secondary | ICD-10-CM | POA: Diagnosis not present

## 2021-04-10 DIAGNOSIS — E7849 Other hyperlipidemia: Secondary | ICD-10-CM | POA: Diagnosis not present

## 2021-04-24 DIAGNOSIS — B009 Herpesviral infection, unspecified: Secondary | ICD-10-CM | POA: Diagnosis not present

## 2021-04-24 DIAGNOSIS — F439 Reaction to severe stress, unspecified: Secondary | ICD-10-CM | POA: Diagnosis not present

## 2021-04-24 DIAGNOSIS — Z8249 Family history of ischemic heart disease and other diseases of the circulatory system: Secondary | ICD-10-CM | POA: Diagnosis not present

## 2021-04-24 DIAGNOSIS — E785 Hyperlipidemia, unspecified: Secondary | ICD-10-CM | POA: Diagnosis not present

## 2021-04-24 DIAGNOSIS — Z7982 Long term (current) use of aspirin: Secondary | ICD-10-CM | POA: Diagnosis not present

## 2021-04-24 DIAGNOSIS — K219 Gastro-esophageal reflux disease without esophagitis: Secondary | ICD-10-CM | POA: Diagnosis not present

## 2021-04-24 DIAGNOSIS — I499 Cardiac arrhythmia, unspecified: Secondary | ICD-10-CM | POA: Diagnosis not present

## 2021-04-24 DIAGNOSIS — Z823 Family history of stroke: Secondary | ICD-10-CM | POA: Diagnosis not present

## 2021-04-24 DIAGNOSIS — R030 Elevated blood-pressure reading, without diagnosis of hypertension: Secondary | ICD-10-CM | POA: Diagnosis not present

## 2021-05-04 DIAGNOSIS — F418 Other specified anxiety disorders: Secondary | ICD-10-CM | POA: Diagnosis not present

## 2021-05-11 ENCOUNTER — Ambulatory Visit (HOSPITAL_COMMUNITY)
Admission: RE | Admit: 2021-05-11 | Discharge: 2021-05-11 | Disposition: A | Discharge status: Home / Self Care | Payer: Medicare PPO | Source: Ambulatory Visit | Attending: Family Medicine | Admitting: Family Medicine

## 2021-05-11 ENCOUNTER — Other Ambulatory Visit: Payer: Self-pay

## 2021-05-11 DIAGNOSIS — Z1231 Encounter for screening mammogram for malignant neoplasm of breast: Secondary | ICD-10-CM

## 2021-05-31 ENCOUNTER — Telehealth: Payer: Self-pay | Admitting: Cardiovascular Disease

## 2021-05-31 NOTE — Telephone Encounter (Signed)
Pt called in and has an appt on 11/14 , she stated that she is concerned about her calcuim score at 787 and wanted to know if Dr Marisue Ivan thought she should be seen sooner?  She is also going to be out of town for 2 month after 11/16.  She is wanting to find out something before then.  I check with all PA and provider as of right not there was nothing available sooner.    [phone]

## 2021-05-31 NOTE — Telephone Encounter (Signed)
Called patient, she states that she had a calcium score with her primary care provider and her calcium score was 787. I advised that I did not see this report, but requested them to send it to [place], and we would have him review this and go based off of this report on when the visit should be done.   Patient verbalized, fax number given. Patient will have the report sent for [place] to review when Dr.O'Neal is back in office.    Just sending to MD as Juluis Rainier.  Thanks!

## 2021-06-01 NOTE — Telephone Encounter (Signed)
  Pt is calling back to check if we received her calcium score report. She was told that it was faxed to day

## 2021-06-01 NOTE — Telephone Encounter (Signed)
Returned call to patient who states that she had her Calcium Score faxed from her PCP to our office yesterday and was calling to ensure that we received it. Advised patient that I checked Dr. Kathalene Frames basket and did not see that Fax had been received yet. Advised patient I would forward message to Dr. Kathalene Frames nurse to follow up on Calcium Score being faxed to the office. Patient verbalized understanding.

## 2021-06-04 NOTE — Telephone Encounter (Signed)
Called pt to let her know the paperwork has not been received. She will have it re-faxed.

## 2021-06-04 NOTE — Telephone Encounter (Signed)
Patient is following up. She would like to know whether or not the Calcium Score has been received. Please advise.

## 2021-06-04 NOTE — Telephone Encounter (Signed)
Patient is following up. Again, she would like to know whether or not the Calcium Score has been received. Please advise.

## 2021-06-05 NOTE — Telephone Encounter (Signed)
Called patient, advised that I received scan from [facility]- I will have Dr.O'Neal review when he comes back into office on Thursday-  Patient is very concerned about her score and would like to have other testing or appointment done sooner since she is going to be leaving for the winter to stay with her daughter out of town. I advised that after he reviewed this I would call her and let her know his recommendations.  Patient verbalized understanding.

## 2021-06-07 NOTE — Telephone Encounter (Signed)
Called patient- advised that MD reviewed the scan and advised if she was not having symptoms she was okay to wait until November- continue statin and aspirin.  Patient verbalized understanding.

## 2021-06-09 ENCOUNTER — Other Ambulatory Visit: Payer: Self-pay

## 2021-06-09 ENCOUNTER — Ambulatory Visit
Admission: EM | Admit: 2021-06-09 | Discharge: 2021-06-09 | Disposition: A | Discharge status: Home / Self Care | Payer: Medicare PPO | Attending: Urgent Care | Admitting: Urgent Care

## 2021-06-09 DIAGNOSIS — L089 Local infection of the skin and subcutaneous tissue, unspecified: Secondary | ICD-10-CM

## 2021-06-09 DIAGNOSIS — L249 Irritant contact dermatitis, unspecified cause: Secondary | ICD-10-CM

## 2021-06-09 MED ORDER — CEPHALEXIN 500 MG PO CAPS: 500.0000 mg | ORAL_CAPSULE | Freq: Three times a day (TID) | ORAL | 0 refills | Status: DC

## 2021-06-09 MED ORDER — TRIAMCINOLONE ACETONIDE 0.1 % EX CREA: 1.0000 "application " | TOPICAL_CREAM | Freq: Two times a day (BID) | CUTANEOUS | 0 refills | Status: DC

## 2021-06-09 NOTE — ED Triage Notes (Signed)
Pt presents with red area. One on thumb and on right foot, pt states husband currently taking antibiotics for MRSA

## 2021-06-09 NOTE — ED Provider Notes (Signed)
[place]   MRN: [id number] DOB: 02/04/1956  Subjective:   Melinda Garza is a 65 y.o. female presenting for 1-2 day history of acute onset redness over the left thumb, right foot.  The left thumb area itches and looks like it has come to ahead.  Patient does pick pecans from her pecan tree.  No particular insect or bug bites.  Has not tried any medications for relief she has concerns about staph infections as her husband has MRSA infection and is currently taking antibiotics.  No current facility-administered medications for this encounter.  Current Outpatient Medications:    aspirin 81 MG chewable tablet, Chew by mouth daily., Disp: , Rfl:    Coenzyme Q10 (CO Q 10 PO), Take 1 tablet by mouth daily., Disp: , Rfl:    pantoprazole (PROTONIX) 40 MG tablet, Take 40 mg by mouth daily., Disp: , Rfl:    rosuvastatin (CRESTOR) 40 MG tablet, Take 1 tablet (40 mg total) by mouth daily., Disp: 90 tablet, Rfl: 3   valACYclovir (VALTREX) 500 MG tablet, Take 1 tablet by mouth as needed., Disp: , Rfl:    Allergies  Allergen Reactions   Sulfonamide Derivatives Hives    Past Medical History:  Diagnosis Date   CAD (coronary artery disease), native coronary artery    50% proximal RCA by heart catheterization 2004 - [facility]   Cancer ([place])    Basal cell on nose.   Depression    Dyspareunia    Essential hypertension, benign    GERD (gastroesophageal reflux disease)    History of abnormal cervical Pap smear 1990   History of hepatitis    HSV-1 (herpes simplex virus 1) infection    Hyperlipidemia    Osteopenia      Past Surgical History:  Procedure Laterality Date   APPENDECTOMY  1963   BASAL CELL CARCINOMA EXCISION  2000   Removed from nose   BREAST SURGERY  04/2015   breast reduction--David Exie Parody, MD   CARDIAC CATHETERIZATION     COLONOSCOPY N/A 08/03/2014   Procedure: COLONOSCOPY;  Surgeon: Rogene Houston, MD;  [facility];  Service: Endoscopy;  Laterality: N/A;   [place]   ESOPHAGOGASTRODUODENOSCOPY N/A 08/03/2014   Procedure: ESOPHAGOGASTRODUODENOSCOPY (EGD);  Surgeon: Rogene Houston, MD;  [facility];  Service: Endoscopy;  Laterality: N/A;   ESOPHAGOGASTRODUODENOSCOPY N/A 09/19/2014   Procedure: ESOPHAGOGASTRODUODENOSCOPY (EGD);  Surgeon: Rogene Houston, MD;  [facility];  Service: Endoscopy;  Laterality: N/A;  730 under fluoro in OR   SAVORY DILATION N/A 09/19/2014   Procedure: SAVORY DILATION;  Surgeon: Rogene Houston, MD;  [facility];  Service: Endoscopy;  Laterality: N/A;   TUBAL LIGATION  1990    Family History  Problem Relation Age of Onset   Hypertension Mother    Osteoporosis Mother    Hyperlipidemia Mother    Hypertension Father    Heart attack Father    Hyperlipidemia Father    Hypertension Sister    Breast cancer Other    Hypertension Brother    Stroke Maternal Grandmother    Breast cancer Paternal Grandmother     Social History   Tobacco Use   Smoking status: Never   Smokeless tobacco: Never  Vaping Use   Vaping Use: Never used  Substance Use Topics   Alcohol use: Yes    Alcohol/week: 3.0 standard drinks    Types: 3 Standard drinks or equivalent per week  Comment: Occasional wine, beer   Drug use: No    ROS   Objective:   Vitals: BP (!) 150/93   Pulse 64   Temp (!) 97.5 F (36.4 C)   Resp 18   LMP 08/27/2003   SpO2 100%   Physical Exam Constitutional:      General: She is not in acute distress.    Appearance: Normal appearance. She is well-developed. She is not ill-appearing.  HENT:     Head: Normocephalic and atraumatic.     Nose: Nose normal.     Mouth/Throat:     Mouth: Mucous membranes are moist.     Pharynx: Oropharynx is clear.  Eyes:     General: No scleral icterus.    Extraocular Movements: Extraocular movements intact.     Pupils: Pupils are equal, round, and reactive to light.  Cardiovascular:     Rate and Rhythm: Normal rate.   Pulmonary:     Effort: Pulmonary effort is normal.  Musculoskeletal:       Hands:       Feet:  Skin:    General: Skin is warm and dry.  Neurological:     General: No focal deficit present.     Mental Status: She is alert and oriented to person, place, and time.  Psychiatric:        Mood and Affect: Mood normal.        Behavior: Behavior normal.    Assessment and Plan :   PDMP not reviewed this encounter.  1. Finger infection   2. Irritant contact dermatitis, unspecified trigger     Suspect contact dermatitis, recommended triamcinolone for her foot and Keflex for the left thumb to address infection.  Supportive care otherwise.  Counseled on wound care. Counseled patient on potential for adverse effects with medications prescribed/recommended today, ER and return-to-clinic precautions discussed, patient verbalized understanding.    Jaynee Eagles, PA-C 06/09/21 1209

## 2021-06-12 DIAGNOSIS — F418 Other specified anxiety disorders: Secondary | ICD-10-CM | POA: Diagnosis not present

## 2021-06-13 DIAGNOSIS — H8110 Benign paroxysmal vertigo, unspecified ear: Secondary | ICD-10-CM | POA: Diagnosis not present

## 2021-06-13 DIAGNOSIS — R519 Headache, unspecified: Secondary | ICD-10-CM | POA: Diagnosis not present

## 2021-06-13 DIAGNOSIS — R0789 Other chest pain: Secondary | ICD-10-CM | POA: Diagnosis not present

## 2021-06-13 DIAGNOSIS — I471 Supraventricular tachycardia: Secondary | ICD-10-CM | POA: Diagnosis not present

## 2021-06-13 DIAGNOSIS — Z6820 Body mass index (BMI) 20.0-20.9, adult: Secondary | ICD-10-CM | POA: Diagnosis not present

## 2021-06-13 DIAGNOSIS — R5383 Other fatigue: Secondary | ICD-10-CM | POA: Diagnosis not present

## 2021-06-19 ENCOUNTER — Other Ambulatory Visit (HOSPITAL_COMMUNITY): Payer: Self-pay | Admitting: Internal Medicine

## 2021-06-19 ENCOUNTER — Other Ambulatory Visit: Payer: Self-pay | Admitting: Internal Medicine

## 2021-06-19 ENCOUNTER — Other Ambulatory Visit (HOSPITAL_BASED_OUTPATIENT_CLINIC_OR_DEPARTMENT_OTHER): Payer: Self-pay | Admitting: Internal Medicine

## 2021-06-19 DIAGNOSIS — R519 Headache, unspecified: Secondary | ICD-10-CM

## 2021-06-26 DIAGNOSIS — F418 Other specified anxiety disorders: Secondary | ICD-10-CM | POA: Diagnosis not present

## 2021-07-06 ENCOUNTER — Encounter (HOSPITAL_COMMUNITY): Payer: Self-pay

## 2021-07-06 ENCOUNTER — Other Ambulatory Visit: Payer: Self-pay

## 2021-07-06 ENCOUNTER — Ambulatory Visit (HOSPITAL_COMMUNITY)
Admission: RE | Admit: 2021-07-06 | Discharge: 2021-07-06 | Disposition: A | Discharge status: Home / Self Care | Payer: Medicare PPO | Source: Ambulatory Visit | Attending: Internal Medicine | Admitting: Internal Medicine

## 2021-07-06 DIAGNOSIS — R42 Dizziness and giddiness: Secondary | ICD-10-CM | POA: Diagnosis not present

## 2021-07-06 DIAGNOSIS — R519 Headache, unspecified: Secondary | ICD-10-CM | POA: Insufficient documentation

## 2021-07-08 NOTE — Progress Notes (Signed)
Cardiology Office Note:   Date:  07/09/2021  NAME:  Melinda Garza    MRN: [id number] DOB:  1956-04-22   PCP:  Sharilyn Sites, MD  Cardiologist:  None  Electrophysiologist:  None   Referring MD: Sharilyn Sites, MD   Chief Complaint  Patient presents with   Follow-up        History of Present Illness:   Melinda Garza is a 65 y.o. female with a hx of CAD, PVCs, HLD who presents for follow-up.  She recently underwent coronary calcium scoring.  This demonstrated calcium score of 787 which is 90th percentile.  Much of her coronary calcium was in the RCA distribution.  This fits with her prior history of a 50% lesion of the proximal RCA.  She reports no symptoms of angina or shortness of breath.  She informed me that she is working out over 1 hour/day.  This includes aerobic yoga classes at the [facility].  She also informs me she can walk several miles without any limitations.  Her most recent cholesterol profile shows her LDL is at goal.  She does have a high HDL value.  Her body mass index is 21.  Her blood pressure is normal.  She is tolerating Crestor well without any major issues.  She is on no blood pressure medications.  She is also on aspirin.  She really maintains a high level of activity without any symptoms suggestive of underlying angina.  I counseled her that her coronary calcium score is not unsurprising given her prior history.  Problem List 1. Non-obstructive CAD -50% pRCA 2004 -CAC score 787 (90th percentile) 05/2021 2. HLD -T chol 184, HDL 76, LDL 73, triglycerides 76 3. PVCs -<1% burden  Past Medical History: Past Medical History:  Diagnosis Date   CAD (coronary artery disease), native coronary artery    50% proximal RCA by heart catheterization 2004 - [facility]   Cancer ([place])    Basal cell on nose.   Depression    Dyspareunia    Essential hypertension, benign    GERD (gastroesophageal reflux disease)    History of abnormal cervical Pap smear 1990   History of hepatitis     HSV-1 (herpes simplex virus 1) infection    Hyperlipidemia    Osteopenia     Past Surgical History: Past Surgical History:  Procedure Laterality Date   APPENDECTOMY  1963   BASAL CELL CARCINOMA EXCISION  2000   Removed from nose   BREAST SURGERY  04/2015   breast reduction--David Exie Parody, MD   CARDIAC CATHETERIZATION     COLONOSCOPY N/A 08/03/2014   Procedure: COLONOSCOPY;  Surgeon: Rogene Houston, MD;  [facility];  Service: Endoscopy;  Laterality: N/A;  [place]   ESOPHAGOGASTRODUODENOSCOPY N/A 08/03/2014   Procedure: ESOPHAGOGASTRODUODENOSCOPY (EGD);  Surgeon: Rogene Houston, MD;  [facility];  Service: Endoscopy;  Laterality: N/A;   ESOPHAGOGASTRODUODENOSCOPY N/A 09/19/2014   Procedure: ESOPHAGOGASTRODUODENOSCOPY (EGD);  Surgeon: Rogene Houston, MD;  [facility];  Service: Endoscopy;  Laterality: N/A;  730 under fluoro in OR   SAVORY DILATION N/A 09/19/2014   Procedure: SAVORY DILATION;  Surgeon: Rogene Houston, MD;  [facility];  Service: Endoscopy;  Laterality: N/A;   TUBAL LIGATION  1990    Current Medications: Current Meds  Medication Sig   aspirin 81 MG chewable tablet Chew by mouth daily.   Coenzyme Q10 (CO Q 10 PO) Take 1 tablet by  mouth daily.   Fluorouracil (TOLAK) 4 % CREA Apply to face nightly x 2 weeks   pantoprazole (PROTONIX) 40 MG tablet Take 40 mg by mouth daily.   valACYclovir (VALTREX) 500 MG tablet Take 1 tablet by mouth as needed.   [DISCONTINUED] rosuvastatin (CRESTOR) 40 MG tablet Take 1 tablet (40 mg total) by mouth daily.     Allergies:    Sulfonamide derivatives   Social History: Social History   Socioeconomic History   Marital status: Married    Spouse name: Not on file   Number of children: 2   Years of education: Not on file   Highest education level: Not on file  Occupational History   Occupation: retired    Fish farm manager: [employer]  Tobacco Use   Smoking status: Never    Smokeless tobacco: Never  Vaping Use   Vaping Use: Never used  Substance and Sexual Activity   Alcohol use: Yes    Alcohol/week: 3.0 standard drinks    Types: 3 Standard drinks or equivalent per week    Comment: Occasional wine, beer   Drug use: No   Sexual activity: Yes    Partners: Male    Birth control/protection: Post-menopausal, Surgical    Comment: BTSP  Other Topics Concern   Not on file  Social History Narrative   Not on file   Social Determinants of Health   Financial Resource Strain: Not on file  Food Insecurity: Not on file  Transportation Needs: Not on file  Physical Activity: Not on file  Stress: Not on file  Social Connections: Not on file     Family History: The patient's family history includes Breast cancer in her paternal grandmother and another family member; Heart attack in her father; Hyperlipidemia in her father and mother; Hypertension in her brother, father, mother, and sister; Osteoporosis in her mother; Stroke in her maternal grandmother.  ROS:   All other ROS reviewed and negative. Pertinent positives noted in the HPI.     EKGs/Labs/Other Studies Reviewed:   The following studies were personally reviewed by me today:  Recent Labs: No results found for requested labs within last 8760 hours.   Recent Lipid Panel    Component Value Date/Time   CHOL 156 11/08/2020 1002   TRIG 80 11/08/2020 1002   HDL 68 11/08/2020 1002   CHOLHDL 2.3 11/08/2020 1002   CHOLHDL 3.3 Ratio 10/24/2008 2037   VLDL 13 10/24/2008 2037   LDLCALC 73 11/08/2020 1002    Physical Exam:   VS:  BP 128/80 (BP Location: Left Arm, Patient Position: Sitting, Cuff Size: Normal)   Pulse 65   Resp 20   Ht 5\' 5"  (1.651 m)   Wt 129 lb (58.5 kg)   LMP 08/27/2003   SpO2 100%   BMI 21.47 kg/m    Wt Readings from Last 3 Encounters:  07/09/21 129 lb (58.5 kg)  11/16/20 131 lb 3.2 oz (59.5 kg)  08/03/20 143 lb 9.6 oz (65.1 kg)    General: Well nourished, well developed, in  no acute distress Head: Atraumatic, normal size  Eyes: PEERLA, EOMI  Neck: Supple, no JVD Endocrine: No thryomegaly Cardiac: Normal S1, S2; RRR; no murmurs, rubs, or gallops Lungs: Clear to auscultation bilaterally, no wheezing, rhonchi or rales  Abd: Soft, nontender, no hepatomegaly  Ext: No edema, pulses 2+ Musculoskeletal: No deformities, BUE and BLE strength normal and equal Skin: Warm and dry, no rashes   Neuro: Alert and oriented to person, place, time, and  situation, CNII-XII grossly intact, no focal deficits  Psych: Normal mood and affect   ASSESSMENT:   Melinda Garza is a 65 y.o. female who presents for the following: 1. Palpitations   2. PVC (premature ventricular contraction)   3. Coronary artery disease involving native coronary artery of native heart without angina pectoris   4. Agatston coronary artery calcium score greater than 400   5. Mixed hyperlipidemia     PLAN:   1. Palpitations 2. PVC (premature ventricular contraction) -No issues.  Continue to monitor.  3. Coronary artery disease involving native coronary artery of native heart without angina pectoris 4. Agatston coronary artery calcium score greater than 400 5. Mixed hyperlipidemia -Coronary calcium score 787 which is 90th percentile.  Much of her coronary calcium is in the RCA distribution.  She underwent cardiac catheterization in 2004 where she was found to have a 50% proximal RCA lesion.  Her calcium score is not unsurprising to be elevated.  I suspect much of this is stable plaque. -She describes no symptoms of angina.  She can exercise for 1 to 2 hours/day.  She can walk several miles without limitations.  I do not believe she needs a stress test as she maintains a high level of activity without any symptoms suggestive of underlying angina. -Her most recent LDL is close to goal.  Would recommend to continue Crestor 40 mg daily.  Given her high HDL cholesterol I think this is okay.  I have also  recommended she continue aspirin 81 mg daily. -Blood pressure is well controlled.  She has immaculate diet.  She is stating her exercise goals.  We will see her yearly.  Disposition: Return in about 1 year (around 07/09/2022).  Medication Adjustments/Labs and Tests Ordered: Current medicines are reviewed at length with the patient today.  Concerns regarding medicines are outlined above.  No orders of the defined types were placed in this encounter.  Meds ordered this encounter  Medications   rosuvastatin (CRESTOR) 40 MG tablet    Sig: Take 1 tablet (40 mg total) by mouth daily.    Dispense:  90 tablet    Refill:  3    Patient Instructions  Medication Instructions:  The current medical regimen is effective;  continue present plan and medications.  *If you need a refill on your cardiac medications before your next appointment, please call your pharmacy*  Follow-Up: At [facility], you and your health needs are our priority.  As part of our continuing mission to provide you with exceptional heart care, we have created designated Provider Care Teams.  These Care Teams include your primary Cardiologist (physician) and Advanced Practice Providers (APPs -  Physician Assistants and Nurse Practitioners) who all work together to provide you with the care you need, when you need it.  We recommend signing up for the patient portal called "MyChart".  Sign up information is provided on this After Visit Summary.  MyChart is used to connect with patients for Virtual Visits (Telemedicine).  Patients are able to view lab/test results, encounter notes, upcoming appointments, etc.  Non-urgent messages can be sent to your provider as well.   To learn more about what you can do with MyChart, go to [email or web address].    Your next appointment:   12 month(s)  The format for your next appointment:   In Person  Provider:   Eleonore Chiquito, MD    Time Spent with Patient: I have spent a total of 35  minutes with patient reviewing  hospital notes, telemetry, EKGs, labs and examining the patient as well as establishing an assessment and plan that was discussed with the patient.  > 50% of time was spent in direct patient care.  Signed, Addison Naegeli. Audie Box, MD, [place]  [address], [place], [place] [address] [phone]  07/09/2021 7:33 PM

## 2021-07-09 ENCOUNTER — Other Ambulatory Visit: Payer: Self-pay

## 2021-07-09 ENCOUNTER — Ambulatory Visit: Payer: Medicare PPO | Admitting: Physician Assistant

## 2021-07-09 ENCOUNTER — Ambulatory Visit: Payer: Medicare PPO | Admitting: Cardiovascular Disease

## 2021-07-09 ENCOUNTER — Encounter: Payer: Self-pay | Admitting: Physician Assistant

## 2021-07-09 ENCOUNTER — Encounter: Payer: Self-pay | Admitting: Cardiovascular Disease

## 2021-07-09 VITALS — BP 128/80 | HR 65 | Resp 20 | Ht 65.0 in | Wt 129.0 lb

## 2021-07-09 DIAGNOSIS — E782 Mixed hyperlipidemia: Secondary | ICD-10-CM

## 2021-07-09 DIAGNOSIS — R002 Palpitations: Secondary | ICD-10-CM

## 2021-07-09 DIAGNOSIS — Z1283 Encounter for screening for malignant neoplasm of skin: Secondary | ICD-10-CM

## 2021-07-09 DIAGNOSIS — L570 Actinic keratosis: Secondary | ICD-10-CM

## 2021-07-09 DIAGNOSIS — I493 Ventricular premature depolarization: Secondary | ICD-10-CM | POA: Diagnosis not present

## 2021-07-09 DIAGNOSIS — R931 Abnormal findings on diagnostic imaging of heart and coronary circulation: Secondary | ICD-10-CM

## 2021-07-09 DIAGNOSIS — I2510 Atherosclerotic heart disease of native coronary artery without angina pectoris: Secondary | ICD-10-CM

## 2021-07-09 MED ORDER — TOLAK 4 % EX CREA: TOPICAL_CREAM | CUTANEOUS | 0 refills | Status: DC

## 2021-07-09 MED ORDER — ROSUVASTATIN CALCIUM 40 MG PO TABS: 40.0000 mg | ORAL_TABLET | Freq: Every day | ORAL | 3 refills | Status: DC

## 2021-07-09 NOTE — Patient Instructions (Signed)

## 2021-07-10 ENCOUNTER — Encounter: Payer: Self-pay | Admitting: *Deleted

## 2021-07-10 ENCOUNTER — Encounter: Payer: Self-pay | Admitting: Physician Assistant

## 2021-07-10 NOTE — Progress Notes (Signed)
   Follow-Up Visit   Subjective  Melinda Garza is a 65 y.o. female who presents for the following: Annual Exam.   The following portions of the chart were reviewed this encounter and updated as appropriate:  Tobacco  Allergies  Meds  Problems  Med Hx  Surg Hx  Fam Hx      Objective  Well appearing patient in no apparent distress; mood and affect are within normal limits.  A full examination was performed including scalp, head, eyes, ears, nose, lips, neck, chest, axillae, abdomen, back, buttocks, bilateral upper extremities, bilateral lower extremities, hands, feet, fingers, toes, fingernails, and toenails. All findings within normal limits unless otherwise noted below.  Head to toe Erythematous patches with gritty scale.   Assessment & Plan  AK (actinic keratosis) Head to toe  Defer any treatment today start tolak after treatment   Fluorouracil (TOLAK) 4 % CREA - Head to toe Apply to face nightly x 2 weeks    I, Rozalia Dino, PA-C, have reviewed all documentation's for this visit.  The documentation on 07/10/21 for the exam, diagnosis, procedures and orders are all accurate and complete.

## 2021-08-02 DIAGNOSIS — J40 Bronchitis, not specified as acute or chronic: Secondary | ICD-10-CM | POA: Diagnosis not present

## 2021-09-05 NOTE — Telephone Encounter (Signed)
error    This encounter was created in error - please disregard.

## 2021-10-15 DIAGNOSIS — F418 Other specified anxiety disorders: Secondary | ICD-10-CM | POA: Diagnosis not present

## 2021-11-06 DIAGNOSIS — F418 Other specified anxiety disorders: Secondary | ICD-10-CM | POA: Diagnosis not present

## 2021-11-13 ENCOUNTER — Other Ambulatory Visit: Payer: Self-pay

## 2021-11-13 ENCOUNTER — Ambulatory Visit (HOSPITAL_COMMUNITY)
Admission: RE | Admit: 2021-11-13 | Discharge: 2021-11-13 | Disposition: A | Discharge status: Home / Self Care | Payer: Medicare PPO | Source: Ambulatory Visit | Attending: Family Medicine | Admitting: Family Medicine

## 2021-11-13 ENCOUNTER — Other Ambulatory Visit (HOSPITAL_COMMUNITY): Payer: Self-pay | Admitting: Family Medicine

## 2021-11-13 DIAGNOSIS — R059 Cough, unspecified: Secondary | ICD-10-CM

## 2021-11-13 DIAGNOSIS — Z6821 Body mass index (BMI) 21.0-21.9, adult: Secondary | ICD-10-CM | POA: Diagnosis not present

## 2021-11-13 DIAGNOSIS — J069 Acute upper respiratory infection, unspecified: Secondary | ICD-10-CM | POA: Diagnosis not present

## 2022-01-17 DIAGNOSIS — M25512 Pain in left shoulder: Secondary | ICD-10-CM | POA: Diagnosis not present

## 2022-04-12 ENCOUNTER — Other Ambulatory Visit (HOSPITAL_COMMUNITY): Payer: Self-pay | Admitting: Family Medicine

## 2022-04-12 DIAGNOSIS — Z1231 Encounter for screening mammogram for malignant neoplasm of breast: Secondary | ICD-10-CM

## 2022-04-24 DIAGNOSIS — E782 Mixed hyperlipidemia: Secondary | ICD-10-CM | POA: Diagnosis not present

## 2022-04-24 DIAGNOSIS — Z6820 Body mass index (BMI) 20.0-20.9, adult: Secondary | ICD-10-CM | POA: Diagnosis not present

## 2022-04-24 DIAGNOSIS — M19012 Primary osteoarthritis, left shoulder: Secondary | ICD-10-CM | POA: Diagnosis not present

## 2022-04-24 DIAGNOSIS — R7309 Other abnormal glucose: Secondary | ICD-10-CM | POA: Diagnosis not present

## 2022-04-24 DIAGNOSIS — R5383 Other fatigue: Secondary | ICD-10-CM | POA: Diagnosis not present

## 2022-04-24 DIAGNOSIS — Z1331 Encounter for screening for depression: Secondary | ICD-10-CM | POA: Diagnosis not present

## 2022-04-24 DIAGNOSIS — R519 Headache, unspecified: Secondary | ICD-10-CM | POA: Diagnosis not present

## 2022-04-24 DIAGNOSIS — R0789 Other chest pain: Secondary | ICD-10-CM | POA: Diagnosis not present

## 2022-04-24 DIAGNOSIS — E7849 Other hyperlipidemia: Secondary | ICD-10-CM | POA: Diagnosis not present

## 2022-04-24 DIAGNOSIS — Z0001 Encounter for general adult medical examination with abnormal findings: Secondary | ICD-10-CM | POA: Diagnosis not present

## 2022-05-13 ENCOUNTER — Ambulatory Visit (HOSPITAL_COMMUNITY)
Admission: RE | Admit: 2022-05-13 | Discharge: 2022-05-13 | Disposition: A | Discharge status: Home / Self Care | Payer: Medicare PPO | Source: Ambulatory Visit | Attending: Family Medicine | Admitting: Family Medicine

## 2022-05-13 DIAGNOSIS — Z1231 Encounter for screening mammogram for malignant neoplasm of breast: Secondary | ICD-10-CM | POA: Diagnosis not present

## 2022-05-31 ENCOUNTER — Other Ambulatory Visit (HOSPITAL_COMMUNITY): Payer: Self-pay | Admitting: Family Medicine

## 2022-05-31 DIAGNOSIS — R519 Headache, unspecified: Secondary | ICD-10-CM

## 2022-06-21 ENCOUNTER — Ambulatory Visit (HOSPITAL_COMMUNITY)
Admission: RE | Admit: 2022-06-21 | Discharge: 2022-06-21 | Disposition: A | Discharge status: Home / Self Care | Payer: Medicare PPO | Source: Ambulatory Visit | Attending: Family Medicine | Admitting: Family Medicine

## 2022-06-21 DIAGNOSIS — R42 Dizziness and giddiness: Secondary | ICD-10-CM | POA: Diagnosis not present

## 2022-06-21 DIAGNOSIS — G319 Degenerative disease of nervous system, unspecified: Secondary | ICD-10-CM | POA: Diagnosis not present

## 2022-06-21 DIAGNOSIS — R519 Headache, unspecified: Secondary | ICD-10-CM

## 2022-06-21 MED ORDER — GADOBUTROL 1 MMOL/ML IV SOLN
5.5000 mL | Freq: Once | INTRAVENOUS | Status: AC | PRN
  Administered 2022-06-21: 5.5 mL via INTRAVENOUS

## 2022-07-06 ENCOUNTER — Encounter (INDEPENDENT_AMBULATORY_CARE_PROVIDER_SITE_OTHER): Payer: Self-pay | Admitting: Gastroenterology

## 2022-07-09 ENCOUNTER — Ambulatory Visit: Payer: Medicare PPO | Admitting: Physician Assistant

## 2022-07-11 DIAGNOSIS — L821 Other seborrheic keratosis: Secondary | ICD-10-CM | POA: Diagnosis not present

## 2022-07-11 DIAGNOSIS — Z1283 Encounter for screening for malignant neoplasm of skin: Secondary | ICD-10-CM | POA: Diagnosis not present

## 2022-07-11 DIAGNOSIS — D225 Melanocytic nevi of trunk: Secondary | ICD-10-CM | POA: Diagnosis not present

## 2022-07-11 DIAGNOSIS — L820 Inflamed seborrheic keratosis: Secondary | ICD-10-CM | POA: Diagnosis not present

## 2022-07-11 DIAGNOSIS — L578 Other skin changes due to chronic exposure to nonionizing radiation: Secondary | ICD-10-CM | POA: Diagnosis not present

## 2022-08-15 DIAGNOSIS — S335XXA Sprain of ligaments of lumbar spine, initial encounter: Secondary | ICD-10-CM | POA: Diagnosis not present

## 2022-08-15 DIAGNOSIS — M9902 Segmental and somatic dysfunction of thoracic region: Secondary | ICD-10-CM | POA: Diagnosis not present

## 2022-08-15 DIAGNOSIS — S233XXA Sprain of ligaments of thoracic spine, initial encounter: Secondary | ICD-10-CM | POA: Diagnosis not present

## 2022-08-15 DIAGNOSIS — M9901 Segmental and somatic dysfunction of cervical region: Secondary | ICD-10-CM | POA: Diagnosis not present

## 2022-08-15 DIAGNOSIS — M9904 Segmental and somatic dysfunction of sacral region: Secondary | ICD-10-CM | POA: Diagnosis not present

## 2022-08-15 DIAGNOSIS — S134XXA Sprain of ligaments of cervical spine, initial encounter: Secondary | ICD-10-CM | POA: Diagnosis not present

## 2022-08-15 DIAGNOSIS — M9903 Segmental and somatic dysfunction of lumbar region: Secondary | ICD-10-CM | POA: Diagnosis not present

## 2022-08-21 DIAGNOSIS — Z049 Encounter for examination and observation for unspecified reason: Secondary | ICD-10-CM | POA: Diagnosis not present

## 2022-08-21 DIAGNOSIS — G43019 Migraine without aura, intractable, without status migrainosus: Secondary | ICD-10-CM | POA: Diagnosis not present

## 2022-08-23 DIAGNOSIS — M9903 Segmental and somatic dysfunction of lumbar region: Secondary | ICD-10-CM | POA: Diagnosis not present

## 2022-08-23 DIAGNOSIS — M9902 Segmental and somatic dysfunction of thoracic region: Secondary | ICD-10-CM | POA: Diagnosis not present

## 2022-08-23 DIAGNOSIS — S335XXA Sprain of ligaments of lumbar spine, initial encounter: Secondary | ICD-10-CM | POA: Diagnosis not present

## 2022-08-23 DIAGNOSIS — M9901 Segmental and somatic dysfunction of cervical region: Secondary | ICD-10-CM | POA: Diagnosis not present

## 2022-08-23 DIAGNOSIS — M9904 Segmental and somatic dysfunction of sacral region: Secondary | ICD-10-CM | POA: Diagnosis not present

## 2022-08-23 DIAGNOSIS — S134XXA Sprain of ligaments of cervical spine, initial encounter: Secondary | ICD-10-CM | POA: Diagnosis not present

## 2022-08-23 DIAGNOSIS — S233XXA Sprain of ligaments of thoracic spine, initial encounter: Secondary | ICD-10-CM | POA: Diagnosis not present

## 2022-08-29 DIAGNOSIS — S233XXA Sprain of ligaments of thoracic spine, initial encounter: Secondary | ICD-10-CM | POA: Diagnosis not present

## 2022-08-29 DIAGNOSIS — M9904 Segmental and somatic dysfunction of sacral region: Secondary | ICD-10-CM | POA: Diagnosis not present

## 2022-08-29 DIAGNOSIS — M9902 Segmental and somatic dysfunction of thoracic region: Secondary | ICD-10-CM | POA: Diagnosis not present

## 2022-08-29 DIAGNOSIS — S335XXA Sprain of ligaments of lumbar spine, initial encounter: Secondary | ICD-10-CM | POA: Diagnosis not present

## 2022-08-29 DIAGNOSIS — M9901 Segmental and somatic dysfunction of cervical region: Secondary | ICD-10-CM | POA: Diagnosis not present

## 2022-08-29 DIAGNOSIS — M9903 Segmental and somatic dysfunction of lumbar region: Secondary | ICD-10-CM | POA: Diagnosis not present

## 2022-08-29 DIAGNOSIS — S134XXA Sprain of ligaments of cervical spine, initial encounter: Secondary | ICD-10-CM | POA: Diagnosis not present

## 2022-09-04 DIAGNOSIS — M5451 Vertebrogenic low back pain: Secondary | ICD-10-CM | POA: Diagnosis not present

## 2022-09-04 DIAGNOSIS — M542 Cervicalgia: Secondary | ICD-10-CM | POA: Diagnosis not present

## 2022-09-04 DIAGNOSIS — M9904 Segmental and somatic dysfunction of sacral region: Secondary | ICD-10-CM | POA: Diagnosis not present

## 2022-09-04 DIAGNOSIS — M546 Pain in thoracic spine: Secondary | ICD-10-CM | POA: Diagnosis not present

## 2022-09-04 DIAGNOSIS — M9901 Segmental and somatic dysfunction of cervical region: Secondary | ICD-10-CM | POA: Diagnosis not present

## 2022-09-04 DIAGNOSIS — M9902 Segmental and somatic dysfunction of thoracic region: Secondary | ICD-10-CM | POA: Diagnosis not present

## 2022-09-09 DIAGNOSIS — M542 Cervicalgia: Secondary | ICD-10-CM | POA: Diagnosis not present

## 2022-09-09 DIAGNOSIS — M9902 Segmental and somatic dysfunction of thoracic region: Secondary | ICD-10-CM | POA: Diagnosis not present

## 2022-09-09 DIAGNOSIS — M5451 Vertebrogenic low back pain: Secondary | ICD-10-CM | POA: Diagnosis not present

## 2022-09-09 DIAGNOSIS — M9904 Segmental and somatic dysfunction of sacral region: Secondary | ICD-10-CM | POA: Diagnosis not present

## 2022-09-09 DIAGNOSIS — M546 Pain in thoracic spine: Secondary | ICD-10-CM | POA: Diagnosis not present

## 2022-09-09 DIAGNOSIS — M9901 Segmental and somatic dysfunction of cervical region: Secondary | ICD-10-CM | POA: Diagnosis not present

## 2022-09-18 DIAGNOSIS — M546 Pain in thoracic spine: Secondary | ICD-10-CM | POA: Diagnosis not present

## 2022-09-18 DIAGNOSIS — M9902 Segmental and somatic dysfunction of thoracic region: Secondary | ICD-10-CM | POA: Diagnosis not present

## 2022-09-18 DIAGNOSIS — M9901 Segmental and somatic dysfunction of cervical region: Secondary | ICD-10-CM | POA: Diagnosis not present

## 2022-09-18 DIAGNOSIS — M542 Cervicalgia: Secondary | ICD-10-CM | POA: Diagnosis not present

## 2022-09-18 DIAGNOSIS — M5451 Vertebrogenic low back pain: Secondary | ICD-10-CM | POA: Diagnosis not present

## 2022-09-18 DIAGNOSIS — M9904 Segmental and somatic dysfunction of sacral region: Secondary | ICD-10-CM | POA: Diagnosis not present

## 2022-09-23 DIAGNOSIS — M546 Pain in thoracic spine: Secondary | ICD-10-CM | POA: Diagnosis not present

## 2022-09-23 DIAGNOSIS — M542 Cervicalgia: Secondary | ICD-10-CM | POA: Diagnosis not present

## 2022-09-23 DIAGNOSIS — M9904 Segmental and somatic dysfunction of sacral region: Secondary | ICD-10-CM | POA: Diagnosis not present

## 2022-09-23 DIAGNOSIS — M9901 Segmental and somatic dysfunction of cervical region: Secondary | ICD-10-CM | POA: Diagnosis not present

## 2022-09-23 DIAGNOSIS — M5451 Vertebrogenic low back pain: Secondary | ICD-10-CM | POA: Diagnosis not present

## 2022-09-23 DIAGNOSIS — M9902 Segmental and somatic dysfunction of thoracic region: Secondary | ICD-10-CM | POA: Diagnosis not present

## 2022-09-26 DIAGNOSIS — M546 Pain in thoracic spine: Secondary | ICD-10-CM | POA: Diagnosis not present

## 2022-09-26 DIAGNOSIS — M9904 Segmental and somatic dysfunction of sacral region: Secondary | ICD-10-CM | POA: Diagnosis not present

## 2022-09-26 DIAGNOSIS — M9901 Segmental and somatic dysfunction of cervical region: Secondary | ICD-10-CM | POA: Diagnosis not present

## 2022-09-26 DIAGNOSIS — M5451 Vertebrogenic low back pain: Secondary | ICD-10-CM | POA: Diagnosis not present

## 2022-09-26 DIAGNOSIS — M9902 Segmental and somatic dysfunction of thoracic region: Secondary | ICD-10-CM | POA: Diagnosis not present

## 2022-09-26 DIAGNOSIS — M542 Cervicalgia: Secondary | ICD-10-CM | POA: Diagnosis not present

## 2022-10-01 DIAGNOSIS — M9902 Segmental and somatic dysfunction of thoracic region: Secondary | ICD-10-CM | POA: Diagnosis not present

## 2022-10-01 DIAGNOSIS — M5451 Vertebrogenic low back pain: Secondary | ICD-10-CM | POA: Diagnosis not present

## 2022-10-01 DIAGNOSIS — M546 Pain in thoracic spine: Secondary | ICD-10-CM | POA: Diagnosis not present

## 2022-10-01 DIAGNOSIS — M9904 Segmental and somatic dysfunction of sacral region: Secondary | ICD-10-CM | POA: Diagnosis not present

## 2022-10-08 DIAGNOSIS — M9902 Segmental and somatic dysfunction of thoracic region: Secondary | ICD-10-CM | POA: Diagnosis not present

## 2022-10-08 DIAGNOSIS — M9904 Segmental and somatic dysfunction of sacral region: Secondary | ICD-10-CM | POA: Diagnosis not present

## 2022-10-08 DIAGNOSIS — M546 Pain in thoracic spine: Secondary | ICD-10-CM | POA: Diagnosis not present

## 2022-10-08 DIAGNOSIS — M5451 Vertebrogenic low back pain: Secondary | ICD-10-CM | POA: Diagnosis not present

## 2022-10-15 DIAGNOSIS — M9904 Segmental and somatic dysfunction of sacral region: Secondary | ICD-10-CM | POA: Diagnosis not present

## 2022-10-15 DIAGNOSIS — M9902 Segmental and somatic dysfunction of thoracic region: Secondary | ICD-10-CM | POA: Diagnosis not present

## 2022-10-15 DIAGNOSIS — M546 Pain in thoracic spine: Secondary | ICD-10-CM | POA: Diagnosis not present

## 2022-10-15 DIAGNOSIS — M5451 Vertebrogenic low back pain: Secondary | ICD-10-CM | POA: Diagnosis not present

## 2022-10-17 DIAGNOSIS — M546 Pain in thoracic spine: Secondary | ICD-10-CM | POA: Diagnosis not present

## 2022-10-17 DIAGNOSIS — M9902 Segmental and somatic dysfunction of thoracic region: Secondary | ICD-10-CM | POA: Diagnosis not present

## 2022-10-17 DIAGNOSIS — M5451 Vertebrogenic low back pain: Secondary | ICD-10-CM | POA: Diagnosis not present

## 2022-10-17 DIAGNOSIS — M9904 Segmental and somatic dysfunction of sacral region: Secondary | ICD-10-CM | POA: Diagnosis not present

## 2022-10-23 DIAGNOSIS — M545 Low back pain, unspecified: Secondary | ICD-10-CM | POA: Diagnosis not present

## 2022-10-23 DIAGNOSIS — Z6821 Body mass index (BMI) 21.0-21.9, adult: Secondary | ICD-10-CM | POA: Diagnosis not present

## 2022-10-23 DIAGNOSIS — H6592 Unspecified nonsuppurative otitis media, left ear: Secondary | ICD-10-CM | POA: Diagnosis not present

## 2022-11-11 DIAGNOSIS — M545 Low back pain, unspecified: Secondary | ICD-10-CM | POA: Diagnosis not present

## 2022-11-15 DIAGNOSIS — M545 Low back pain, unspecified: Secondary | ICD-10-CM | POA: Diagnosis not present

## 2022-11-18 DIAGNOSIS — M545 Low back pain, unspecified: Secondary | ICD-10-CM | POA: Diagnosis not present

## 2022-11-18 NOTE — Progress Notes (Unsigned)
Cardiology Office Note:   Date:  11/20/2022  NAME:  Melinda Garza    MRN: [id number] DOB:  16-Feb-1956   PCP:  Sharilyn Sites, MD  Cardiologist:  None  Electrophysiologist:  None   Referring MD: Sharilyn Sites, MD   Chief Complaint  Patient presents with   Follow-up    History of Present Illness:   Melinda Garza is a 67 y.o. female with a hx of CAD, PVCs who presents for follow-up.  She reports she is doing well.  Describes episodes of sharp chest discomfort that can last seconds.  Occurs every few months.  Really without any major symptoms today in office.  BP well-controlled.  LDL close enough to goal.  Overall doing quite well.  No significant palpitations.  EKG is normal.  She is not exercising that much due to back issues.  She is working with physical therapy.  Without any cardiac complaints.  Problem List 1. Non-obstructive CAD -50% pRCA 2004 -CAC score 787 (90th percentile) 05/2021 2. HLD -T chol 152, HDL 59, LDL 77, TG 82 3. PVCs -<1% burden  Past Medical History: Past Medical History:  Diagnosis Date   CAD (coronary artery disease), native coronary artery    50% proximal RCA by heart catheterization 2004 - [facility]   Cancer ([place])    Basal cell on nose.   Depression    Dyspareunia    Essential hypertension, benign    GERD (gastroesophageal reflux disease)    History of abnormal cervical Pap smear 1990   History of hepatitis    HSV-1 (herpes simplex virus 1) infection    Hyperlipidemia    Osteopenia     Past Surgical History: Past Surgical History:  Procedure Laterality Date   APPENDECTOMY  1963   BASAL CELL CARCINOMA EXCISION  2000   Removed from nose   BREAST SURGERY  04/2015   breast reduction--David Exie Parody, MD   CARDIAC CATHETERIZATION     COLONOSCOPY N/A 08/03/2014   Procedure: COLONOSCOPY;  Surgeon: Rogene Houston, MD;  [facility];  Service: Endoscopy;  Laterality: N/A;  [place]   ESOPHAGOGASTRODUODENOSCOPY N/A 08/03/2014    Procedure: ESOPHAGOGASTRODUODENOSCOPY (EGD);  Surgeon: Rogene Houston, MD;  [facility];  Service: Endoscopy;  Laterality: N/A;   ESOPHAGOGASTRODUODENOSCOPY N/A 09/19/2014   Procedure: ESOPHAGOGASTRODUODENOSCOPY (EGD);  Surgeon: Rogene Houston, MD;  [facility];  Service: Endoscopy;  Laterality: N/A;  730 under fluoro in OR   SAVORY DILATION N/A 09/19/2014   Procedure: SAVORY DILATION;  Surgeon: Rogene Houston, MD;  [facility];  Service: Endoscopy;  Laterality: N/A;   TUBAL LIGATION  1990    Current Medications: Current Meds  Medication Sig   aspirin 81 MG chewable tablet Chew by mouth daily.   cholecalciferol (VITAMIN D3) 25 MCG (1000 UNIT) tablet Take 1,000 Units by mouth daily.   Coenzyme Q10 (CO Q 10 PO) Take 1 tablet by mouth daily.   Multiple Vitamins-Minerals (ZINC PO) Take by mouth.   pantoprazole (PROTONIX) 40 MG tablet Take 40 mg by mouth daily.   valACYclovir (VALTREX) 500 MG tablet Take 1 tablet by mouth as needed.   [DISCONTINUED] rosuvastatin (CRESTOR) 40 MG tablet Take 1 tablet (40 mg total) by mouth daily.     Allergies:    Sulfonamide derivatives   Social History: Social History   Socioeconomic History   Marital status: Married    Spouse name: Not on file  Number of children: 2   Years of education: Not on file   Highest education level: Not on file  Occupational History   Occupation: retired    Fish farm manager: [employer]  Tobacco Use   Smoking status: Never   Smokeless tobacco: Never  Vaping Use   Vaping Use: Never used  Substance and Sexual Activity   Alcohol use: Yes    Alcohol/week: 3.0 standard drinks of alcohol    Types: 3 Standard drinks or equivalent per week    Comment: Occasional wine, beer   Drug use: No   Sexual activity: Yes    Partners: Male    Birth control/protection: Post-menopausal, Surgical    Comment: BTSP  Other Topics Concern   Not on file  Social History Narrative   Not on file   Social  Determinants of Health   Financial Resource Strain: Not on file  Food Insecurity: Not on file  Transportation Needs: Not on file  Physical Activity: Not on file  Stress: Not on file  Social Connections: Not on file     Family History: The patient'sfamily history includes Breast cancer in her paternal grandmother and another family member; Heart attack in her father; Hyperlipidemia in her father and mother; Hypertension in her brother, father, mother, and sister; Osteoporosis in her mother; Stroke in her maternal grandmother.  ROS:   All other ROS reviewed and negative. Pertinent positives noted in the HPI.     EKGs/Labs/Other Studies Reviewed:   The following studies were personally reviewed by me today:  EKG:  EKG is ordered today.  The ekg ordered today demonstrates normal sinus rhythm heart rate 70, no acute ischemic changes or evidence of infarction, and was personally reviewed by me.   Recent Labs: No results found for requested labs within last 365 days.   Recent Lipid Panel    Component Value Date/Time   CHOL 156 11/08/2020 1002   TRIG 80 11/08/2020 1002   HDL 68 11/08/2020 1002   CHOLHDL 2.3 11/08/2020 1002   CHOLHDL 3.3 Ratio 10/24/2008 2037   VLDL 13 10/24/2008 2037   LDLCALC 73 11/08/2020 1002    Physical Exam:   VS:  BP 112/68   Pulse 70   Ht 5' 5.5" (1.664 m)   Wt 138 lb (62.6 kg)   LMP 08/27/2003   SpO2 99%   BMI 22.62 kg/m    Wt Readings from Last 3 Encounters:  11/20/22 138 lb (62.6 kg)  07/09/21 129 lb (58.5 kg)  11/16/20 131 lb 3.2 oz (59.5 kg)    General: Well nourished, well developed, in no acute distress Head: Atraumatic, normal size  Eyes: PEERLA, EOMI  Neck: Supple, no JVD Endocrine: No thryomegaly Cardiac: Normal S1, S2; RRR; no murmurs, rubs, or gallops Lungs: Clear to auscultation bilaterally, no wheezing, rhonchi or rales  Abd: Soft, nontender, no hepatomegaly  Ext: No edema, pulses 2+ Musculoskeletal: No deformities, BUE and BLE  strength normal and equal Skin: Warm and dry, no rashes   Neuro: Alert and oriented to person, place, time, and situation, CNII-XII grossly intact, no focal deficits  Psych: Normal mood and affect   ASSESSMENT:   [name] is a 67 y.o. female who presents for the following: 1. Coronary artery disease involving native coronary artery of native heart without angina pectoris   2. Agatston coronary artery calcium score greater than 400   3. Mixed hyperlipidemia   4. PVC (premature ventricular contraction)     PLAN:   1.  Coronary artery disease involving native coronary artery of native heart without angina pectoris 2. Agatston coronary artery calcium score greater than 400 3. Mixed hyperlipidemia -History of 50% RCA stenosis.  Elevated calcium score.  No symptoms of angina.  She has done well with the preventive approach.  Will continue aspirin 81 mg daily.  On Crestor 40 mg daily.  LDL not that far from goal.  Given lack of symptoms and very close to goal we will continue with current regimen.  She will start exercising once her back is improved.  EKG is normal.  No symptoms in office today.  4. PVC (premature ventricular contraction) -History of PVCs.  Less than 1% burden.  No indications to treat.  No symptoms.  Disposition: Return in about 1 year (around 11/20/2023).  Medication Adjustments/Labs and Tests Ordered: Current medicines are reviewed at length with the patient today.  Concerns regarding medicines are outlined above.  Orders Placed This Encounter  Procedures   EKG 12-Lead   Meds ordered this encounter  Medications   rosuvastatin (CRESTOR) 40 MG tablet    Sig: Take 1 tablet (40 mg total) by mouth daily.    Dispense:  90 tablet    Refill:  3    Patient Instructions  Medication Instructions:  The current medical regimen is effective;  continue present plan and medications.  *If you need a refill on your cardiac medications before your next appointment, please call  your pharmacy*   Follow-Up: At [facility], you and your health needs are our priority.  As part of our continuing mission to provide you with exceptional heart care, we have created designated Provider Care Teams.  These Care Teams include your primary Cardiologist (physician) and Advanced Practice Providers (APPs -  Physician Assistants and Nurse Practitioners) who all work together to provide you with the care you need, when you need it.  We recommend signing up for the patient portal called "MyChart".  Sign up information is provided on this After Visit Summary.  MyChart is used to connect with patients for Virtual Visits (Telemedicine).  Patients are able to view lab/test results, encounter notes, upcoming appointments, etc.  Non-urgent messages can be sent to your provider as well.   To learn more about what you can do with MyChart, go to [email or web address].    Your next appointment:   12 month(s)  Provider:   Sande Rives, PA-C, Almyra Deforest, PA-C, or Diona Browner, NP       Time Spent with Patient: I have spent a total of 25 minutes with patient reviewing hospital notes, telemetry, EKGs, labs and examining the patient as well as establishing an assessment and plan that was discussed with the patient.  > 50% of time was spent in direct patient care.  Signed, Addison Naegeli. Audie Box, MD, [place]  [address], [place], [place] [address] [phone]  11/20/2022 9:34 AM

## 2022-11-20 ENCOUNTER — Ambulatory Visit: Payer: Medicare PPO | Attending: Cardiovascular Disease | Admitting: Cardiovascular Disease

## 2022-11-20 ENCOUNTER — Encounter: Payer: Self-pay | Admitting: Cardiovascular Disease

## 2022-11-20 VITALS — BP 112/68 | HR 70 | Ht 65.5 in | Wt 138.0 lb

## 2022-11-20 DIAGNOSIS — R931 Abnormal findings on diagnostic imaging of heart and coronary circulation: Secondary | ICD-10-CM

## 2022-11-20 DIAGNOSIS — I493 Ventricular premature depolarization: Secondary | ICD-10-CM

## 2022-11-20 DIAGNOSIS — E782 Mixed hyperlipidemia: Secondary | ICD-10-CM

## 2022-11-20 DIAGNOSIS — I2510 Atherosclerotic heart disease of native coronary artery without angina pectoris: Secondary | ICD-10-CM

## 2022-11-20 MED ORDER — ROSUVASTATIN CALCIUM 40 MG PO TABS: 40.0000 mg | ORAL_TABLET | Freq: Every day | ORAL | 3 refills | Status: AC

## 2022-11-20 NOTE — Patient Instructions (Signed)
Medication Instructions:  The current medical regimen is effective;  continue present plan and medications.  *If you need a refill on your cardiac medications before your next appointment, please call your pharmacy*   Follow-Up: At [facility], you and your health needs are our priority.  As part of our continuing mission to provide you with exceptional heart care, we have created designated Provider Care Teams.  These Care Teams include your primary Cardiologist (physician) and Advanced Practice Providers (APPs -  Physician Assistants and Nurse Practitioners) who all work together to provide you with the care you need, when you need it.  We recommend signing up for the patient portal called "MyChart".  Sign up information is provided on this After Visit Summary.  MyChart is used to connect with patients for Virtual Visits (Telemedicine).  Patients are able to view lab/test results, encounter notes, upcoming appointments, etc.  Non-urgent messages can be sent to your provider as well.   To learn more about what you can do with MyChart, go to [email or web address].    Your next appointment:   12 month(s)  Provider:   Callie Goodrich, PA-C, Hao Meng, PA-C, or Emily Monge, NP     

## 2022-11-21 DIAGNOSIS — M545 Low back pain, unspecified: Secondary | ICD-10-CM | POA: Diagnosis not present

## 2022-11-25 DIAGNOSIS — M545 Low back pain, unspecified: Secondary | ICD-10-CM | POA: Diagnosis not present

## 2022-11-28 DIAGNOSIS — M545 Low back pain, unspecified: Secondary | ICD-10-CM | POA: Diagnosis not present

## 2022-12-02 DIAGNOSIS — M545 Low back pain, unspecified: Secondary | ICD-10-CM | POA: Diagnosis not present

## 2022-12-05 DIAGNOSIS — M545 Low back pain, unspecified: Secondary | ICD-10-CM | POA: Diagnosis not present

## 2022-12-24 DIAGNOSIS — M545 Low back pain, unspecified: Secondary | ICD-10-CM | POA: Diagnosis not present

## 2022-12-24 DIAGNOSIS — M47896 Other spondylosis, lumbar region: Secondary | ICD-10-CM | POA: Diagnosis not present

## 2023-01-05 DIAGNOSIS — M545 Low back pain, unspecified: Secondary | ICD-10-CM | POA: Diagnosis not present

## 2023-01-28 DIAGNOSIS — M47896 Other spondylosis, lumbar region: Secondary | ICD-10-CM | POA: Diagnosis not present

## 2023-01-28 DIAGNOSIS — M545 Low back pain, unspecified: Secondary | ICD-10-CM | POA: Diagnosis not present

## 2023-04-14 ENCOUNTER — Other Ambulatory Visit (HOSPITAL_COMMUNITY): Payer: Self-pay | Admitting: Family Medicine

## 2023-04-14 DIAGNOSIS — Z1231 Encounter for screening mammogram for malignant neoplasm of breast: Secondary | ICD-10-CM

## 2023-05-05 ENCOUNTER — Other Ambulatory Visit (HOSPITAL_COMMUNITY): Payer: Self-pay | Admitting: Family Medicine

## 2023-05-05 DIAGNOSIS — Z0001 Encounter for general adult medical examination with abnormal findings: Secondary | ICD-10-CM | POA: Diagnosis not present

## 2023-05-05 DIAGNOSIS — R7303 Prediabetes: Secondary | ICD-10-CM | POA: Diagnosis not present

## 2023-05-05 DIAGNOSIS — Z6821 Body mass index (BMI) 21.0-21.9, adult: Secondary | ICD-10-CM | POA: Diagnosis not present

## 2023-05-05 DIAGNOSIS — E2839 Other primary ovarian failure: Secondary | ICD-10-CM

## 2023-05-05 DIAGNOSIS — R7309 Other abnormal glucose: Secondary | ICD-10-CM | POA: Diagnosis not present

## 2023-05-05 DIAGNOSIS — Z1331 Encounter for screening for depression: Secondary | ICD-10-CM | POA: Diagnosis not present

## 2023-05-05 DIAGNOSIS — E7849 Other hyperlipidemia: Secondary | ICD-10-CM | POA: Diagnosis not present

## 2023-05-05 DIAGNOSIS — E782 Mixed hyperlipidemia: Secondary | ICD-10-CM | POA: Diagnosis not present

## 2023-05-19 ENCOUNTER — Ambulatory Visit (HOSPITAL_COMMUNITY)
Admission: RE | Admit: 2023-05-19 | Discharge: 2023-05-19 | Disposition: A | Discharge status: Home / Self Care | Payer: Medicare PPO | Source: Ambulatory Visit | Attending: Family Medicine | Admitting: Family Medicine

## 2023-05-19 DIAGNOSIS — Z1231 Encounter for screening mammogram for malignant neoplasm of breast: Secondary | ICD-10-CM | POA: Diagnosis not present

## 2023-05-19 DIAGNOSIS — M810 Age-related osteoporosis without current pathological fracture: Secondary | ICD-10-CM | POA: Diagnosis not present

## 2023-05-19 DIAGNOSIS — Z780 Asymptomatic menopausal state: Secondary | ICD-10-CM | POA: Diagnosis not present

## 2023-05-19 DIAGNOSIS — E2839 Other primary ovarian failure: Secondary | ICD-10-CM | POA: Insufficient documentation

## 2023-06-09 DIAGNOSIS — H6592 Unspecified nonsuppurative otitis media, left ear: Secondary | ICD-10-CM | POA: Diagnosis not present

## 2023-06-09 DIAGNOSIS — Z6820 Body mass index (BMI) 20.0-20.9, adult: Secondary | ICD-10-CM | POA: Diagnosis not present

## 2023-06-09 DIAGNOSIS — J069 Acute upper respiratory infection, unspecified: Secondary | ICD-10-CM | POA: Diagnosis not present

## 2023-07-01 DIAGNOSIS — H6523 Chronic serous otitis media, bilateral: Secondary | ICD-10-CM | POA: Diagnosis not present

## 2023-07-01 DIAGNOSIS — S233XXA Sprain of ligaments of thoracic spine, initial encounter: Secondary | ICD-10-CM | POA: Diagnosis not present

## 2023-07-01 DIAGNOSIS — M9903 Segmental and somatic dysfunction of lumbar region: Secondary | ICD-10-CM | POA: Diagnosis not present

## 2023-07-01 DIAGNOSIS — M9904 Segmental and somatic dysfunction of sacral region: Secondary | ICD-10-CM | POA: Diagnosis not present

## 2023-07-01 DIAGNOSIS — M9901 Segmental and somatic dysfunction of cervical region: Secondary | ICD-10-CM | POA: Diagnosis not present

## 2023-07-01 DIAGNOSIS — M9902 Segmental and somatic dysfunction of thoracic region: Secondary | ICD-10-CM | POA: Diagnosis not present

## 2023-07-01 DIAGNOSIS — S134XXA Sprain of ligaments of cervical spine, initial encounter: Secondary | ICD-10-CM | POA: Diagnosis not present

## 2023-07-01 DIAGNOSIS — Z6821 Body mass index (BMI) 21.0-21.9, adult: Secondary | ICD-10-CM | POA: Diagnosis not present

## 2023-07-01 DIAGNOSIS — S335XXA Sprain of ligaments of lumbar spine, initial encounter: Secondary | ICD-10-CM | POA: Diagnosis not present

## 2023-07-10 DIAGNOSIS — H6523 Chronic serous otitis media, bilateral: Secondary | ICD-10-CM | POA: Diagnosis not present

## 2023-07-10 DIAGNOSIS — E2839 Other primary ovarian failure: Secondary | ICD-10-CM | POA: Diagnosis not present

## 2023-07-10 DIAGNOSIS — Z6821 Body mass index (BMI) 21.0-21.9, adult: Secondary | ICD-10-CM | POA: Diagnosis not present

## 2023-07-10 DIAGNOSIS — Z0001 Encounter for general adult medical examination with abnormal findings: Secondary | ICD-10-CM | POA: Diagnosis not present

## 2023-07-22 DIAGNOSIS — H9313 Tinnitus, bilateral: Secondary | ICD-10-CM | POA: Diagnosis not present

## 2023-07-22 DIAGNOSIS — H6993 Unspecified Eustachian tube disorder, bilateral: Secondary | ICD-10-CM | POA: Diagnosis not present

## 2023-07-22 DIAGNOSIS — R42 Dizziness and giddiness: Secondary | ICD-10-CM | POA: Diagnosis not present

## 2023-08-09 ENCOUNTER — Ambulatory Visit
Admission: EM | Admit: 2023-08-09 | Discharge: 2023-08-09 | Disposition: A | Discharge status: Home / Self Care | Payer: Medicare PPO | Attending: Family Medicine | Admitting: Family Medicine

## 2023-08-09 DIAGNOSIS — J069 Acute upper respiratory infection, unspecified: Secondary | ICD-10-CM

## 2023-08-09 MED ORDER — AZELASTINE HCL 0.1 % NA SOLN: 1.0000 | Freq: Two times a day (BID) | NASAL | 1 refills | Status: DC

## 2023-08-09 MED ORDER — PREDNISONE 20 MG PO TABS: 40.0000 mg | ORAL_TABLET | Freq: Every day | ORAL | 0 refills | Status: DC

## 2023-08-09 NOTE — Discharge Instructions (Signed)
In addition to the prescribed medications, you may take Flonase, plain Mucinex, do warm saline sinus rinses, take Coricidin HBP, run humidifiers, drink hot tea.

## 2023-08-09 NOTE — ED Provider Notes (Signed)
[facility]    CSN: [id number] Arrival date & time: 08/09/23  0846      History   Chief Complaint No chief complaint on file.   HPI Melinda Garza is a 67 y.o. female.   Presenting today with 5-day history of sore throat, sinus drainage, ear pressure, fatigue.  Denies fever, chills, body aches, chest pain, shortness of breath, cough, abdominal pain, nausea vomiting or diarrhea.  So far trying saline sinus rinses, DayQuil with minimal relief.    Past Medical History:  Diagnosis Date   CAD (coronary artery disease), native coronary artery    50% proximal RCA by heart catheterization 2004 - [facility]   Cancer (HCC)    Basal cell on nose.   Depression    Dyspareunia    Essential hypertension, benign    GERD (gastroesophageal reflux disease)    History of abnormal cervical Pap smear 1990   History of hepatitis    HSV-1 (herpes simplex virus 1) infection    Hyperlipidemia    Osteopenia     Patient Active Problem List   Diagnosis Date Noted   GERD (gastroesophageal reflux disease) 07/05/2014   Dysphagia, pharyngoesophageal phase 07/05/2014   Rectal bleeding 07/05/2014   Palpitations 01/07/2014   Atypical chest pain 01/07/2014   Coronary atherosclerosis of native coronary artery 01/07/2014   Mixed hyperlipidemia 03/11/2008   DEPRESSION 03/11/2008   Essential hypertension, benign 03/11/2008   Disorder of bone and cartilage 03/11/2008    Past Surgical History:  Procedure Laterality Date   APPENDECTOMY  1963   BASAL CELL CARCINOMA EXCISION  2000   Removed from nose   BREAST SURGERY  04/2015   breast reduction--David Nechama Guard, MD   CARDIAC CATHETERIZATION     COLONOSCOPY N/A 08/03/2014   Procedure: COLONOSCOPY;  Surgeon: Malissa Hippo, MD;  [facility]: [facility];  Service: Endoscopy;  Laterality: N/A;  100   COLPOSCOPY  1985   ESOPHAGOGASTRODUODENOSCOPY N/A 08/03/2014   Procedure: ESOPHAGOGASTRODUODENOSCOPY (EGD);  Surgeon: Malissa Hippo, MD;  Location: [facility];  Service: Endoscopy;  Laterality: N/A;   ESOPHAGOGASTRODUODENOSCOPY N/A 09/19/2014   Procedure: ESOPHAGOGASTRODUODENOSCOPY (EGD);  Surgeon: Malissa Hippo, MD;  Location: [facility];  Service: Endoscopy;  Laterality: N/A;  730 under fluoro in OR   SAVORY DILATION N/A 09/19/2014   Procedure: SAVORY DILATION;  Surgeon: Malissa Hippo, MD;  Location: [facility];  Service: Endoscopy;  Laterality: N/A;   TUBAL LIGATION  1990    OB History     Gravida  2   Para  2   Term  2   Preterm      AB      Living  2      SAB      IAB      Ectopic      Multiple      Live Births  1            Home Medications    Prior to Admission medications   Medication Sig Start Date End Date Taking? Authorizing Provider  azelastine (ASTELIN) 0.1 % nasal spray Place 1 spray into both nostrils 2 (two) times daily. Use in each nostril as directed 08/09/23  Yes Particia Nearing, PA-C  predniSONE (DELTASONE) 20 MG tablet Take 2 tablets (40 mg total) by mouth daily with breakfast. 08/09/23  Yes Particia Nearing, PA-C  aspirin 81 MG chewable tablet Chew by mouth daily.    [provider]  cholecalciferol (  VITAMIN D3) 25 MCG (1000 UNIT) tablet Take 1,000 Units by mouth daily.    [provider]  Coenzyme Q10 (CO Q 10 PO) Take 1 tablet by mouth daily.    [provider]  Multiple Vitamins-Minerals (ZINC PO) Take by mouth.    [provider]  pantoprazole (PROTONIX) 40 MG tablet Take 40 mg by mouth daily.    [provider]  rosuvastatin (CRESTOR) 40 MG tablet Take 1 tablet (40 mg total) by mouth daily. 11/20/22   O'NealRonnald Ramp, MD  valACYclovir (VALTREX) 500 MG tablet Take 1 tablet by mouth as needed. 01/09/16   [provider]    Family History Family History  Problem Relation Age of Onset   Hypertension Mother    Osteoporosis Mother    Hyperlipidemia Mother    Hypertension Father    Heart attack Father     Hyperlipidemia Father    Hypertension Sister    Breast cancer Other    Hypertension Brother    Stroke Maternal Grandmother    Breast cancer Paternal Grandmother     Social History Social History   Tobacco Use   Smoking status: Never   Smokeless tobacco: Never  Vaping Use   Vaping status: Never Used  Substance Use Topics   Alcohol use: Yes    Alcohol/week: 3.0 standard drinks of alcohol    Types: 3 Standard drinks or equivalent per week    Comment: Occasional wine, beer   Drug use: No     Allergies   Sulfonamide derivatives   Review of Systems Review of Systems Per HPI  Physical Exam Triage Vital Signs ED Triage Vitals  Encounter Vitals Group     BP 08/09/23 0903 (!) 142/96     Systolic BP Percentile --      Diastolic BP Percentile --      Pulse Rate 08/09/23 0903 92     Resp 08/09/23 0903 14     Temp 08/09/23 0903 97.7 F (36.5 C)     Temp Source 08/09/23 0903 Oral     SpO2 08/09/23 0903 97 %     Weight --      Height --      Head Circumference --      Peak Flow --      Pain Score 08/09/23 0904 0     Pain Loc --      Pain Education --      Exclude from Growth Chart --    No data found.  Updated Vital Signs BP (!) 142/96 (BP Location: Right Arm)   Pulse 92   Temp 97.7 F (36.5 C) (Oral)   Resp 14   LMP 08/27/2003   SpO2 97%   Visual Acuity Right Eye Distance:   Left Eye Distance:   Bilateral Distance:    Right Eye Near:   Left Eye Near:    Bilateral Near:     Physical Exam Vitals and nursing note reviewed.  Constitutional:      Appearance: Normal appearance.  HENT:     Head: Atraumatic.     Right Ear: Tympanic membrane and external ear normal.     Left Ear: Tympanic membrane and external ear normal.     Nose: Rhinorrhea present.     Mouth/Throat:     Mouth: Mucous membranes are moist.     Pharynx: Posterior oropharyngeal erythema present.  Eyes:     Extraocular Movements: Extraocular movements intact.     Conjunctiva/sclera:  Conjunctivae normal.  Cardiovascular:     Rate and Rhythm: Normal rate and regular rhythm.     Heart sounds: Normal heart sounds.  Pulmonary:     Effort: Pulmonary effort is normal.     Breath sounds: Normal breath sounds. No wheezing or rales.  Musculoskeletal:        General: Normal range of motion.     Cervical back: Normal range of motion and neck supple.  Skin:    General: Skin is warm and dry.  Neurological:     Mental Status: She is alert and oriented to person, place, and time.  Psychiatric:        Mood and Affect: Mood normal.        Thought Content: Thought content normal.      UC Treatments / Results  Labs (all labs ordered are listed, but only abnormal results are displayed) Labs Reviewed - No data to display  EKG   Radiology No results found.  Procedures Procedures (including critical care time)  Medications Ordered in UC Medications - No data to display  Initial Impression / Assessment and Plan / UC Course  I have reviewed the triage vital signs and the nursing notes.  Pertinent labs & imaging results that were available during my care of the patient were reviewed by me and considered in my medical decision making (see chart for details).     Consistent with viral respiratory infection, declines viral testing today.  Will treat symptomatically with Astelin, short course of prednisone as she has to fly on an airplane in a few days to help open her eustachian tubes and discussed supportive over-the-counter medications, home care.  Return for worsening symptoms.  Final Clinical Impressions(s) / UC Diagnoses   Final diagnoses:  Viral URI     Discharge Instructions      In addition to the prescribed medications, you may take Flonase, plain Mucinex, do warm saline sinus rinses, take Coricidin HBP, run humidifiers, drink hot tea.     ED Prescriptions     Medication Sig Dispense Auth. Provider   azelastine (ASTELIN) 0.1 % nasal spray Place 1 spray  into both nostrils 2 (two) times daily. Use in each nostril as directed 30 mL Particia Nearing, PA-C   predniSONE (DELTASONE) 20 MG tablet Take 2 tablets (40 mg total) by mouth daily with breakfast. 10 tablet Particia Nearing, [place]      PDMP not reviewed this encounter.   Roosvelt Maser [place], [place] 08/09/23 [phone]

## 2023-08-09 NOTE — ED Triage Notes (Signed)
Pt reports she has a sore throat, throat drainage, yellow mucus when she blows her nose x 5 days

## 2023-12-07 NOTE — Progress Notes (Unsigned)
 Cardiology Office Note:  .   Date:  12/08/2023  ID:  Melinda Garza, DOB 05-14-1956, MRN [id number] PCP: Minus Amel, MD  [facility] Providers Cardiologist:  None { History of Present Illness: .    Chief Complaint  Patient presents with   Follow-up         Melinda Garza is a 68 y.o. female with history of CAD, PVCs, HLD who presents for follow-up.    History of Present Illness   Melinda Garza is a 68 year old female with nonobstructive coronary artery disease who presents for follow-up.  She experiences occasional palpitations, described as her heart 'racing a little bit,' which she last noticed the previous week. The frequency of these episodes is unclear, but they are not frequent. No chest pain or dyspnea is associated with these palpitations.  Her LDL cholesterol is currently at 66 mg/dL, which is at goal. She is taking Crestor 40 mg daily and reports adherence to this medication regimen. She also takes a daily baby aspirin (81 mg).  There has been a decrease in physical activity over the past year due to a back injury, but she is starting to resume exercise. No new medical issues or recent surgeries are reported.          Problem List 1. Non-obstructive CAD -50% pRCA 2004 -CAC score 787 (90th percentile) 05/2021 2. HLD -T chol 147, HDL 64, LDL 66, TG 94 3. PVCs -<1% burden    ROS: All other ROS reviewed and negative. Pertinent positives noted in the HPI.     Studies Reviewed: Aaron Aas   EKG Interpretation Date/Time:  Monday December 08 2023 09:44:10 EDT Ventricular Rate:  63 PR Interval:  158 QRS Duration:  86 QT Interval:  396 QTC Calculation: 405 R Axis:   12  Text Interpretation: Sinus rhythm with occasional Premature ventricular complexes Confirmed by Jackquelyn Mass [phone]) on 12/08/2023 9:49:29 AM  CAC 05/28/2021 FINDINGS:   CALCIUM SCORE: 787.   LM: 0.  LAD: 283.  Cx: 2.  RCA: 436  PDA: 0   Cardiac anatomy:  Normal heart size, no evidence of  pericardial effusion  Mediastinum: No adenopathy  Visible abdomen: No significant abnormality  Visible lung fields: Visible portions of the lungs are clear    IMPRESSION: Extensive calcified coronary artery plaque putting the patient just above the 90th percentile for age.  Physical Exam:   VS:  BP 132/78 (BP Location: Left Arm, Patient Position: Sitting)   Pulse 63   Ht 5\' 5"  (1.651 m)   Wt 139 lb (63 kg)   LMP 08/27/2003   SpO2 99%   BMI 23.13 kg/m    Wt Readings from Last 3 Encounters:  12/08/23 139 lb (63 kg)  11/20/22 138 lb (62.6 kg)  07/09/21 129 lb (58.5 kg)    GEN: Well nourished, well developed in no acute distress NECK: No JVD; No carotid bruits CARDIAC: RRR, no murmurs, rubs, gallops RESPIRATORY:  Clear to auscultation without rales, wheezing or rhonchi  ABDOMEN: Soft, non-tender, non-distended EXTREMITIES:  No edema; No deformity  ASSESSMENT AND PLAN: .   Assessment and Plan    Nonobstructive Coronary Artery Disease (CAD) Nonobstructive CAD with elevated coronary calcium score of 787, asymptomatic. LDL cholesterol at goal. Emphasized importance of aspirin and Crestor for prevention. - Continue Crestor 40 mg daily. - Continue aspirin 81 mg daily. - Encourage regular exercise and a healthy diet. - Monitor for new symptoms such as chest pain or shortness  of breath.  Hyperlipidemia Hyperlipidemia well-managed with LDL cholesterol level of 66. - Continue Crestor 40 mg daily. - Encourage adherence to a heart-healthy diet and regular physical activity.  Premature Ventricular Contractions (PVCs) PVCs with two isolated PVCs on EKG, infrequent and not bothersome. Discussed lifestyle factors as potential triggers. - Monitor PVCs and advise on lifestyle modifications including adequate hydration, good sleep, and regular exercise. - Reassess if symptoms become bothersome or more frequent.              Follow-up: Return in about 1 year (around  12/07/2024).   Signed, Gigi Kyle. Rolm Clos, MD, [facility]  [facility]  [address], [address] [place], [place] [address] [phone]  9:58 AM

## 2023-12-08 ENCOUNTER — Encounter: Payer: Self-pay | Admitting: Cardiovascular Disease

## 2023-12-08 ENCOUNTER — Ambulatory Visit: Payer: Medicare PPO | Attending: Cardiovascular Disease | Admitting: Cardiovascular Disease

## 2023-12-08 VITALS — BP 132/78 | HR 63 | Ht 65.0 in | Wt 139.0 lb

## 2023-12-08 DIAGNOSIS — E782 Mixed hyperlipidemia: Secondary | ICD-10-CM

## 2023-12-08 DIAGNOSIS — R931 Abnormal findings on diagnostic imaging of heart and coronary circulation: Secondary | ICD-10-CM | POA: Diagnosis not present

## 2023-12-08 DIAGNOSIS — I493 Ventricular premature depolarization: Secondary | ICD-10-CM

## 2023-12-08 DIAGNOSIS — I2510 Atherosclerotic heart disease of native coronary artery without angina pectoris: Secondary | ICD-10-CM | POA: Diagnosis not present

## 2023-12-08 NOTE — Patient Instructions (Signed)
 Medication Instructions:  Your physician recommends that you continue on your current medications as directed. Please refer to the Current Medication list given to you today.    *If you need a refill on your cardiac medications before your next appointment, please call your pharmacy*   Lab Work: NONE    If you have labs (blood work) drawn today and your tests are completely normal, you will receive your results only by: MyChart Message (if you have MyChart) OR A paper copy in the mail If you have any lab test that is abnormal or we need to change your treatment, we will call you to review the results.   Testing/Procedures: NONE    Follow-Up: At [facility], you and your health needs are our priority.  As part of our continuing mission to provide you with exceptional heart care, we have created designated Provider Care Teams.  These Care Teams include your primary Cardiologist (physician) and Advanced Practice Providers (APPs -  Physician Assistants and Nurse Practitioners) who all work together to provide you with the care you need, when you need it.  We recommend signing up for the patient portal called "MyChart".  Sign up information is provided on this After Visit Summary.  MyChart is used to connect with patients for Virtual Visits (Telemedicine).  Patients are able to view lab/test results, encounter notes, upcoming appointments, etc.  Non-urgent messages can be sent to your provider as well.   To learn more about what you can do with MyChart, go to [email or web address].    Your next appointment:   1 year(s)  The format for your next appointment:   In Person  Provider:   Lawana Pray, FNP, Marcie Sever, PA-C, Callie Goodrich, PA-C, Kathleen Johnson, PA-C, Hao Meng, PA-C, Marlana Silvan, NP, or Katlyn West, NP       Other Instructions

## 2024-05-05 DIAGNOSIS — R5383 Other fatigue: Secondary | ICD-10-CM | POA: Diagnosis not present

## 2024-05-05 DIAGNOSIS — E7849 Other hyperlipidemia: Secondary | ICD-10-CM | POA: Diagnosis not present

## 2024-05-05 DIAGNOSIS — R7303 Prediabetes: Secondary | ICD-10-CM | POA: Diagnosis not present

## 2024-05-05 DIAGNOSIS — Z0001 Encounter for general adult medical examination with abnormal findings: Secondary | ICD-10-CM | POA: Diagnosis not present

## 2024-05-05 DIAGNOSIS — Z1331 Encounter for screening for depression: Secondary | ICD-10-CM | POA: Diagnosis not present

## 2024-05-05 DIAGNOSIS — Z6820 Body mass index (BMI) 20.0-20.9, adult: Secondary | ICD-10-CM | POA: Diagnosis not present

## 2024-05-05 DIAGNOSIS — E782 Mixed hyperlipidemia: Secondary | ICD-10-CM | POA: Diagnosis not present

## 2024-05-17 ENCOUNTER — Other Ambulatory Visit (HOSPITAL_COMMUNITY): Payer: Self-pay | Admitting: Family Medicine

## 2024-05-17 DIAGNOSIS — Z1231 Encounter for screening mammogram for malignant neoplasm of breast: Secondary | ICD-10-CM

## 2024-05-26 ENCOUNTER — Encounter (HOSPITAL_COMMUNITY): Payer: Self-pay

## 2024-05-26 ENCOUNTER — Ambulatory Visit (HOSPITAL_COMMUNITY)
Admission: RE | Admit: 2024-05-26 | Discharge: 2024-05-26 | Disposition: A | Discharge status: Home / Self Care | Source: Ambulatory Visit | Attending: Family Medicine | Admitting: Family Medicine

## 2024-05-26 DIAGNOSIS — Z1231 Encounter for screening mammogram for malignant neoplasm of breast: Secondary | ICD-10-CM | POA: Diagnosis present

## 2024-06-09 ENCOUNTER — Encounter (INDEPENDENT_AMBULATORY_CARE_PROVIDER_SITE_OTHER): Payer: Self-pay | Admitting: Gastroenterology

## 2024-07-07 ENCOUNTER — Encounter (INDEPENDENT_AMBULATORY_CARE_PROVIDER_SITE_OTHER): Payer: Self-pay | Admitting: *Deleted

## 2024-07-26 ENCOUNTER — Telehealth (INDEPENDENT_AMBULATORY_CARE_PROVIDER_SITE_OTHER): Payer: Self-pay

## 2024-07-26 NOTE — Telephone Encounter (Signed)
Ok to schedule.  Room :Any   Thanks,  Vista Lawman, MD Gastroenterology and Hepatology [facility]

## 2024-07-26 NOTE — Telephone Encounter (Signed)
 Who is your primary care physician: [facility]  Reasons for the colonoscopy: screening  Have you had a colonoscopy before?  Yes, 08/03/2014  Do you have family history of colon cancer? no  Previous colonoscopy with polyps removed? no  Do you have a history colorectal cancer?   no  Are you diabetic? If yes, Type 1 or Type 2?    no  Do you have a prosthetic or mechanical heart valve? no  Do you have a pacemaker/defibrillator?   no  Have you had endocarditis/atrial fibrillation? no  Have you had joint replacement within the last 12 months?  no  Do you tend to be constipated or have to use laxatives? no  Do you have any history of drugs or alchohol?  no  Do you use supplemental oxygen?  no  Have you had a stroke or heart attack within the last 6 months? no  Do you take weight loss medication?  no  For female patients: have you had a hysterectomy?  no                                     are you post menopausal?       yes                                            do you still have your menstrual cycle? no      Do you take any blood-thinning medications such as: (aspirin, warfarin, Plavix, Aggrenox)  yes  If yes we need the name, milligram, dosage and who is prescribing doctor aspirin 81 mg Current Outpatient Medications on File Prior to Visit  Medication Sig Dispense Refill   aspirin 81 MG chewable tablet Chew by mouth daily.     Calcium  Carbonate-Vit D-Min (CALCIUM  1200) 1200-1000 MG-UNIT CHEW Chew 1,200 mg by mouth daily.     cholecalciferol (VITAMIN D3) 25 MCG (1000 UNIT) tablet Take 1,000 Units by mouth daily.     Coenzyme Q10 (CO Q 10 PO) Take 1 tablet by mouth daily.     escitalopram (LEXAPRO) 10 MG tablet Take 10 mg by mouth daily.     Multiple Vitamins-Minerals (ZINC PO) Take by mouth.     rosuvastatin  (CRESTOR ) 40 MG tablet Take 1 tablet (40 mg total) by mouth daily. 90 tablet 3   valACYclovir (VALTREX) 500 MG tablet Take 1 tablet by mouth as needed.     No  current facility-administered medications on file prior to visit.    Allergies  Allergen Reactions   Sulfonamide Derivatives Hives     Pharmacy: [facility]   Primary Insurance Name: [facility]  Best number where you can be reached: [phone]

## 2024-08-17 NOTE — Telephone Encounter (Signed)
 Spoke to pt and she states she will be out of town until mid March. She would like to be scheduled in April. She will call back to schedule once she gets back in town.

## 2024-08-24 ENCOUNTER — Encounter: Payer: Self-pay | Admitting: Emergency Medicine

## 2024-08-24 ENCOUNTER — Ambulatory Visit
Admission: EM | Admit: 2024-08-24 | Discharge: 2024-08-24 | Disposition: A | Discharge status: Home / Self Care | Source: Home / Self Care

## 2024-08-24 DIAGNOSIS — H6593 Unspecified nonsuppurative otitis media, bilateral: Secondary | ICD-10-CM | POA: Diagnosis not present

## 2024-08-24 DIAGNOSIS — H6993 Unspecified Eustachian tube disorder, bilateral: Secondary | ICD-10-CM

## 2024-08-24 DIAGNOSIS — J309 Allergic rhinitis, unspecified: Secondary | ICD-10-CM

## 2024-08-24 MED ORDER — AZELASTINE HCL 0.1 % NA SOLN: 2.0000 | Freq: Two times a day (BID) | NASAL | 0 refills | Status: AC

## 2024-08-24 MED ORDER — CETIRIZINE-PSEUDOEPHEDRINE ER 5-120 MG PO TB12: 1.0000 | ORAL_TABLET | Freq: Every day | ORAL | 0 refills | Status: AC

## 2024-08-24 NOTE — ED Provider Notes (Signed)
 " [facility]    CSN: [id number] Arrival date & time: 08/24/24  0945      History   Chief Complaint Chief Complaint  Patient presents with   Ear Fullness   Nasal Congestion    HPI Melinda Garza is a 68 y.o. female.   The history is provided by the patient.   Patient presents for complaints of bilateral ear fullness and pressure that has been present for the past 2 weeks.  She also endorses intermittent ear pain.  States that she hears popping and crackling noises.  She states that she has also had some postnasal drainage and runny nose.  Denies fever, chills, headache, ear drainage, cough, abdominal pain, nausea, vomiting, diarrhea, or rash.  She states that she has noted some dizziness when she bends forward.  States that she started taking Claritin on yesterday.  Past Medical History:  Diagnosis Date   CAD (coronary artery disease), native coronary artery    50% proximal RCA by heart catheterization 2004 - [facility]   Cancer (HCC)    Basal cell on nose.   Depression    Dyspareunia    Essential hypertension, benign    GERD (gastroesophageal reflux disease)    History of abnormal cervical Pap smear 1990   History of hepatitis    HSV-1 (herpes simplex virus 1) infection    Hyperlipidemia    Osteopenia     Patient Active Problem List   Diagnosis Date Noted   GERD (gastroesophageal reflux disease) 07/05/2014   Dysphagia, pharyngoesophageal phase 07/05/2014   Rectal bleeding 07/05/2014   Palpitations 01/07/2014   Atypical chest pain 01/07/2014   Coronary atherosclerosis of native coronary artery 01/07/2014   Mixed hyperlipidemia 03/11/2008   DEPRESSION 03/11/2008   Essential hypertension, benign 03/11/2008   Disorder of bone and cartilage 03/11/2008    Past Surgical History:  Procedure Laterality Date   APPENDECTOMY  1963   BASAL CELL CARCINOMA EXCISION  2000   Removed from nose   BREAST SURGERY  04/2015   breast reduction--David Beola, MD   CARDIAC  CATHETERIZATION     COLONOSCOPY N/A 08/03/2014   Procedure: COLONOSCOPY;  Surgeon: Claudis RAYMOND Rivet, MD;  Location: [facility];  Service: Endoscopy;  Laterality: N/A;  100   COLPOSCOPY  1985   ESOPHAGOGASTRODUODENOSCOPY N/A 08/03/2014   Procedure: ESOPHAGOGASTRODUODENOSCOPY (EGD);  Surgeon: Claudis RAYMOND Rivet, MD;  Location: [facility];  Service: Endoscopy;  Laterality: N/A;   ESOPHAGOGASTRODUODENOSCOPY N/A 09/19/2014   Procedure: ESOPHAGOGASTRODUODENOSCOPY (EGD);  Surgeon: Claudis RAYMOND Rivet, MD;  Location: [facility];  Service: Endoscopy;  Laterality: N/A;  730 under fluoro in OR   SAVORY DILATION N/A 09/19/2014   Procedure: SAVORY DILATION;  Surgeon: Claudis RAYMOND Rivet, MD;  Location: [facility];  Service: Endoscopy;  Laterality: N/A;   TUBAL LIGATION  1990    OB History     Gravida  2   Para  2   Term  2   Preterm      AB      Living  2      SAB      IAB      Ectopic      Multiple      Live Births  1            Home Medications    Prior to Admission medications  Medication Sig Start Date End Date Taking? Authorizing Provider  aspirin 81 MG chewable tablet Chew by mouth daily.  [provider]  Calcium  Carbonate-Vit D-Min (CALCIUM  1200) 1200-1000 MG-UNIT CHEW Chew 1,200 mg by mouth daily.    [provider]  cholecalciferol (VITAMIN D3) 25 MCG (1000 UNIT) tablet Take 1,000 Units by mouth daily.    [provider]  Coenzyme Q10 (CO Q 10 PO) Take 1 tablet by mouth daily.    [provider]  escitalopram (LEXAPRO) 10 MG tablet Take 10 mg by mouth daily.    [provider]  Multiple Vitamins-Minerals (ZINC PO) Take by mouth.    [provider]  rosuvastatin  (CRESTOR ) 40 MG tablet Take 1 tablet (40 mg total) by mouth daily. 11/20/22   O'NealDarryle Ned, MD  valACYclovir (VALTREX) 500 MG tablet Take 1 tablet by mouth as needed. 01/09/16   [provider]    Family History Family History   Problem Relation Age of Onset   Hypertension Mother    Osteoporosis Mother    Hyperlipidemia Mother    Hypertension Father    Heart attack Father    Hyperlipidemia Father    Hypertension Sister    Breast cancer Other    Hypertension Brother    Stroke Maternal Grandmother    Breast cancer Paternal Grandmother     Social History Social History[1]   Allergies   Sulfonamide derivatives   Review of Systems Review of Systems Per HPI  Physical Exam Triage Vital Signs ED Triage Vitals  Encounter Vitals Group     BP 08/24/24 1208 134/84     Girls Systolic BP Percentile --      Girls Diastolic BP Percentile --      Boys Systolic BP Percentile --      Boys Diastolic BP Percentile --      Pulse Rate 08/24/24 1208 78     Resp 08/24/24 1208 14     Temp 08/24/24 1208 98.1 F (36.7 C)     Temp Source 08/24/24 1208 Oral     SpO2 08/24/24 1208 97 %     Weight --      Height --      Head Circumference --      Peak Flow --      Pain Score 08/24/24 1207 0     Pain Loc --      Pain Education --      Exclude from Growth Chart --    No data found.  Updated Vital Signs BP 134/84 (BP Location: Right Arm)   Pulse 78   Temp 98.1 F (36.7 C) (Oral)   Resp 14   LMP 08/27/2003   SpO2 97%   Visual Acuity Right Eye Distance:   Left Eye Distance:   Bilateral Distance:    Right Eye Near:   Left Eye Near:    Bilateral Near:     Physical Exam Vitals and nursing note reviewed.  Constitutional:      General: She is not in acute distress.    Appearance: Normal appearance.  HENT:     Head: Normocephalic.     Right Ear: Ear canal and external ear normal. A middle ear effusion is present.     Left Ear: Ear canal and external ear normal. A middle ear effusion is present.     Nose: Nose normal.     Right Turbinates: Enlarged and swollen.     Left Turbinates: Enlarged and swollen.     Right Sinus: No maxillary sinus tenderness or frontal sinus tenderness.     Left Sinus: No  maxillary sinus tenderness or frontal sinus tenderness.     Mouth/Throat:     Lips: Pink.     Mouth: Mucous membranes are moist.     Pharynx: Postnasal drip present. No pharyngeal swelling, oropharyngeal exudate, posterior oropharyngeal erythema or uvula swelling.     Comments: Cobblestoning present to posterior oropharynx  Eyes:     Extraocular Movements: Extraocular movements intact.     Conjunctiva/sclera: Conjunctivae normal.     Pupils: Pupils are equal, round, and reactive to light.  Cardiovascular:     Rate and Rhythm: Normal rate and regular rhythm.     Pulses: Normal pulses.     Heart sounds: Normal heart sounds.  Pulmonary:     Effort: Pulmonary effort is normal. No respiratory distress.     Breath sounds: Normal breath sounds. No stridor. No wheezing, rhonchi or rales.  Musculoskeletal:     Cervical back: Normal range of motion.  Skin:    General: Skin is warm and dry.  Neurological:     General: No focal deficit present.     Mental Status: She is alert and oriented to person, place, and time.  Psychiatric:        Mood and Affect: Mood normal.        Behavior: Behavior normal.      UC Treatments / Results  Labs (all labs ordered are listed, but only abnormal results are displayed) Labs Reviewed - No data to display  EKG   Radiology No results found.  Procedures Procedures (including critical care time)  Medications Ordered in UC Medications - No data to display  Initial Impression / Assessment and Plan / UC Course  I have reviewed the triage vital signs and the nursing notes.  Pertinent labs & imaging results that were available during my care of the patient were reviewed by me and considered in my medical decision making (see chart for details).  On exam, the patient's lung sounds are clear throughout, room air sats are 97%.  She is noted to have bilateral middle ear effusions and allergic rhinitis symptoms.  Will treat with cetirizine 5-120 mg tablets  and azelastine  nasal spray.  Supportive care recommendations were provided and discussed with the patient to include warm compresses to the ears as needed, fluids, rest, and to avoid entrance of water  inside of the ears.  Patient was advised regarding follow-up.  Patient was in agreement with this plan of care and verbalizes understanding.  All questions were answered.  Patient stable for discharge.  Final Clinical Impressions(s) / UC Diagnoses   Final diagnoses:  None   Discharge Instructions   None    ED Prescriptions   None    PDMP not reviewed this encounter.     [1]  Social History Tobacco Use   Smoking status: Never   Smokeless tobacco: Never  Vaping Use   Vaping status: Never Used  Substance Use Topics   Alcohol use: Yes    Alcohol/week: 3.0 standard drinks of alcohol    Types: 3 Standard drinks or equivalent per week    Comment: Occasional wine, beer   Drug use: No     Gilmer Etta PARAS, NP 08/24/24 1237  "

## 2024-08-24 NOTE — Discharge Instructions (Signed)
 Take medication as prescribed. Increase fluids and allow for plenty of rest. You may take over-the-counter Tylenol as needed for pain, fever, general discomfort. Apply warm compresses to the ears as needed for pain or discomfort. Avoid entrance of water  inside of the ears while symptoms persist. Do not insert or stick anything inside of the ears while symptoms persist. If symptoms fail to improve with this treatment, you may follow-up in this clinic or with your primary care physician for further evaluation. Follow-up as needed.

## 2024-08-24 NOTE — ED Triage Notes (Signed)
 Pt reports for past few days having bilateral ear fullness and post nasal drip. Reports tried taking claritin, supplements and hot showers would help. About to go on a trip and would like ears checked before leaves. Denies pain at this time.
